# Patient Record
Sex: Male | Born: 1985 | Race: Black or African American | Hispanic: No | Marital: Single | Smoking: Former smoker
Health system: Southern US, Community
[De-identification: ages and names within clinical notes are randomized; demographics above are authoritative.]

## PROBLEM LIST (undated history)

## (undated) DIAGNOSIS — S62309A Unspecified fracture of unspecified metacarpal bone, initial encounter for closed fracture: Secondary | ICD-10-CM

## (undated) DIAGNOSIS — Z98811 Dental restoration status: Secondary | ICD-10-CM

## (undated) HISTORY — PX: NO PAST SURGERIES: SHX2092

---
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
## 2007-01-21 ENCOUNTER — Emergency Department (HOSPITAL_COMMUNITY): Admission: EM | Admit: 2007-01-21 | Discharge: 2007-01-21 | Payer: Self-pay | Admitting: Emergency Medicine

## 2007-03-28 ENCOUNTER — Emergency Department (HOSPITAL_COMMUNITY): Admission: EM | Admit: 2007-03-28 | Discharge: 2007-03-29 | Payer: Self-pay | Admitting: Emergency Medicine

## 2007-04-11 ENCOUNTER — Emergency Department (HOSPITAL_COMMUNITY): Admission: EM | Admit: 2007-04-11 | Discharge: 2007-04-11 | Payer: Self-pay | Admitting: Emergency Medicine

## 2009-01-12 ENCOUNTER — Observation Stay (HOSPITAL_COMMUNITY): Admission: EM | Admit: 2009-01-12 | Discharge: 2009-01-12 | Payer: Self-pay | Admitting: Emergency Medicine

## 2010-04-26 IMAGING — CT CT NECK W/ CM
3 of 4 series · 16 of 33 positions shown, 19 images · IV contrast (agent unspecified)
Comparison: None

CLINICAL DATA: Sore throat and difficulty swallowing.

CT NECK WITH CONTRAST
TECHNIQUE: Multidetector CT imaging of the neck was performed with
intravenous contrast.
Contrast: 100 ml [id number]-NDD

[Series 3: neck st · axial · 0.39mm/px · z∈[-258,-69]mm · 8 of 83 slices shown, 10 images]
[im 10/83  soft-tissue]
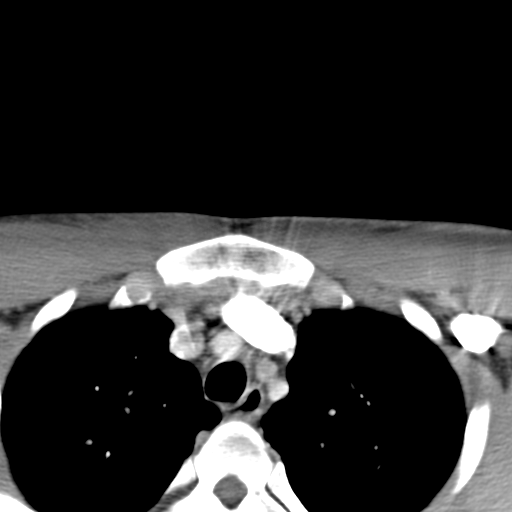
[im 10/83  bone]
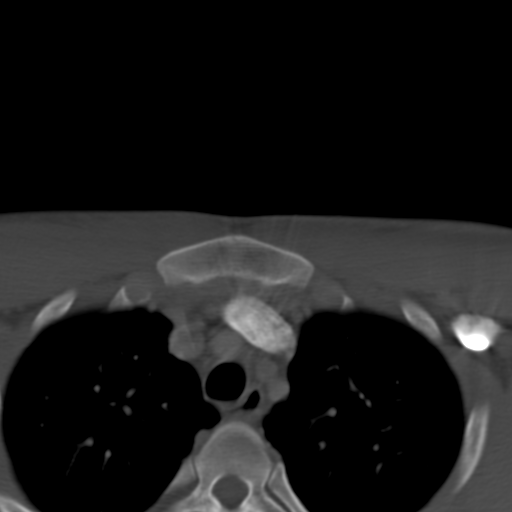
[im 19/83  bone]
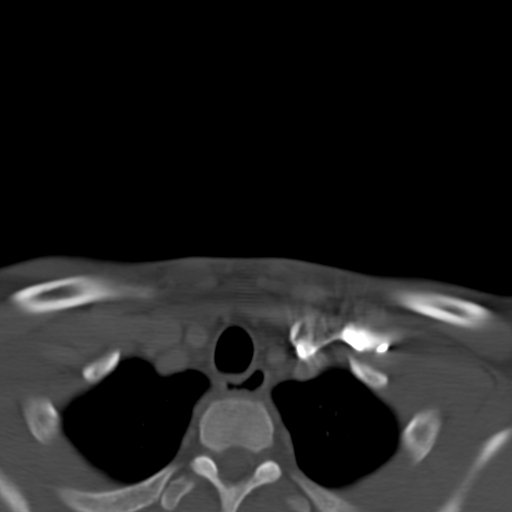
[im 28/83  bone]
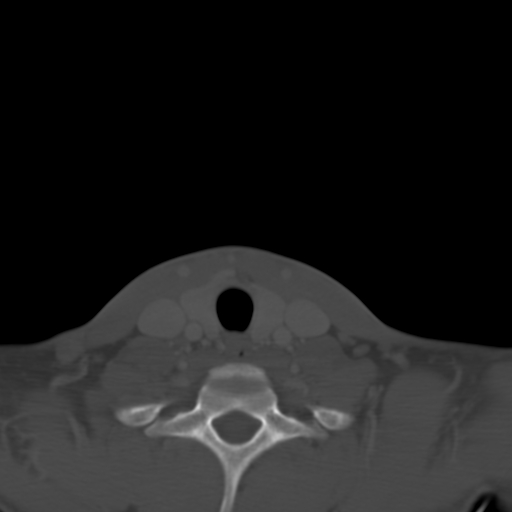
[im 37/83  bone]
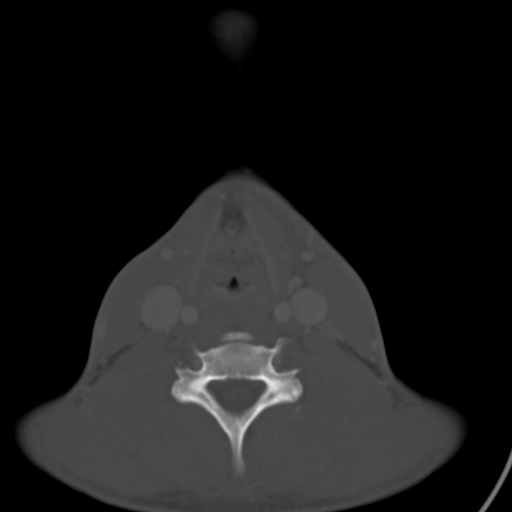
[im 46/83  soft-tissue]
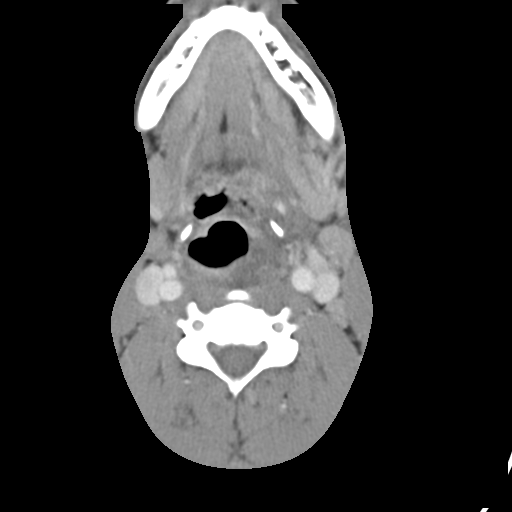
[im 46/83  bone]
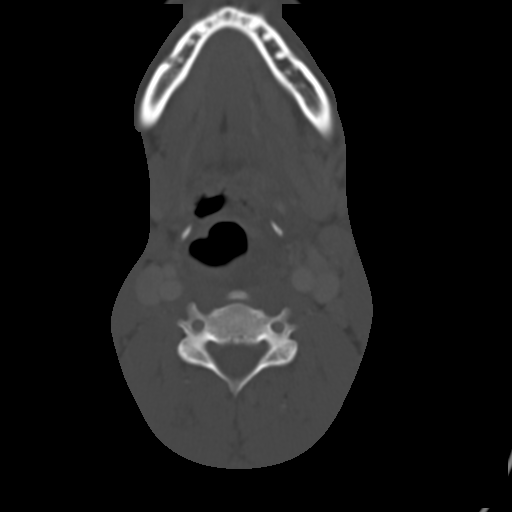
[im 55/83  bone]
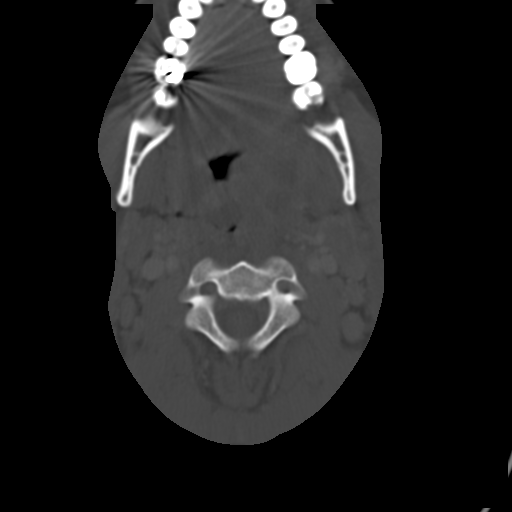
[im 64/83  bone]
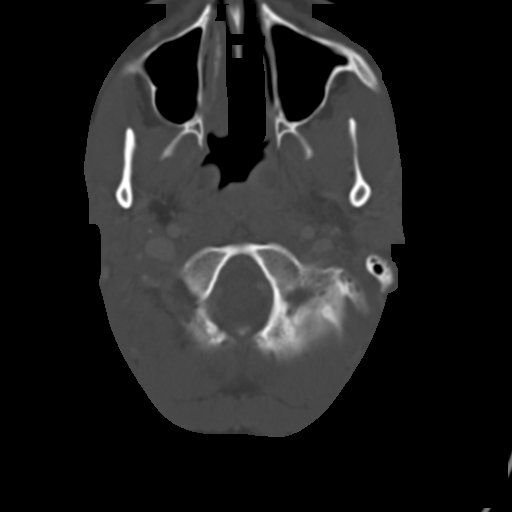
[im 73/83  bone]
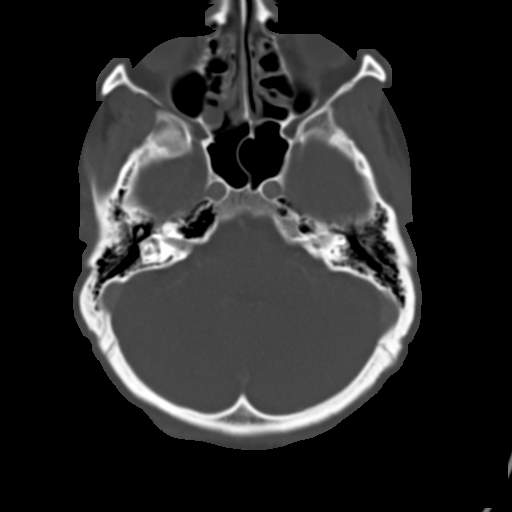

[Series 602: <mpr thick range> · coronal · 0.49mm/px · 3 of 47 slices shown]
[im 13/47  bone]
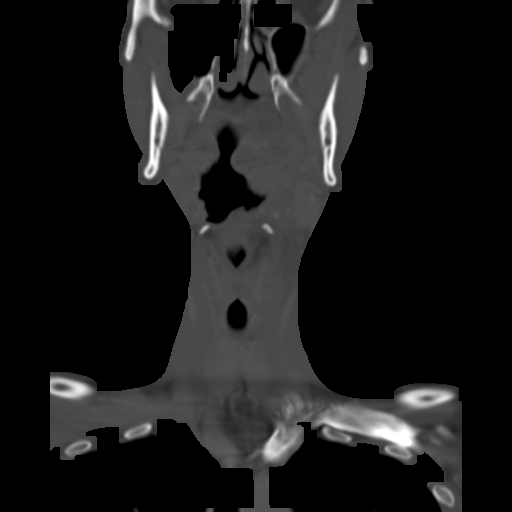
[im 20/47  bone]
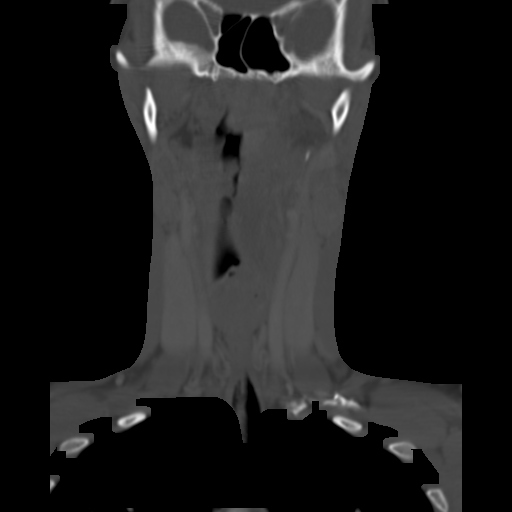
[im 27/47  bone]
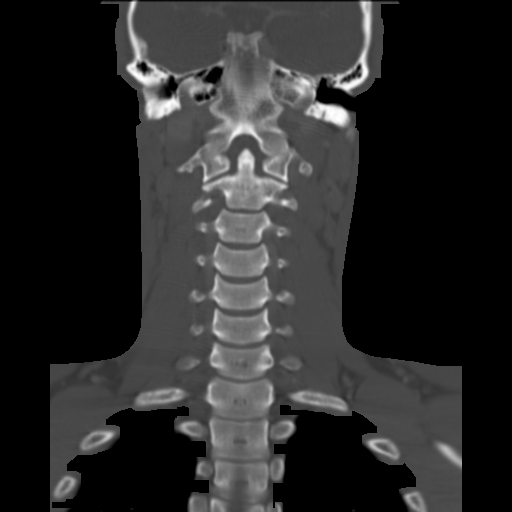

[Series 603: <mpr thick range(1)> · sagittal · 0.49mm/px · 5 of 43 slices shown, 6 images]
[im 15/43  bone]
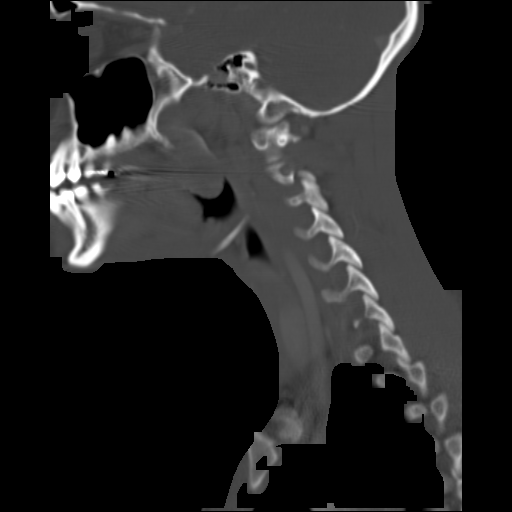
[im 18/43  bone]
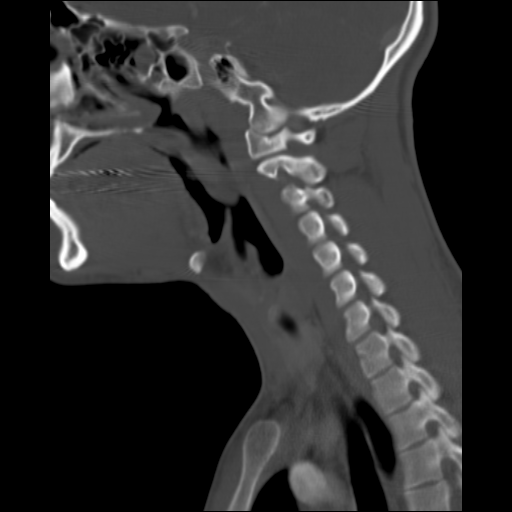
[im 22/43  soft-tissue]
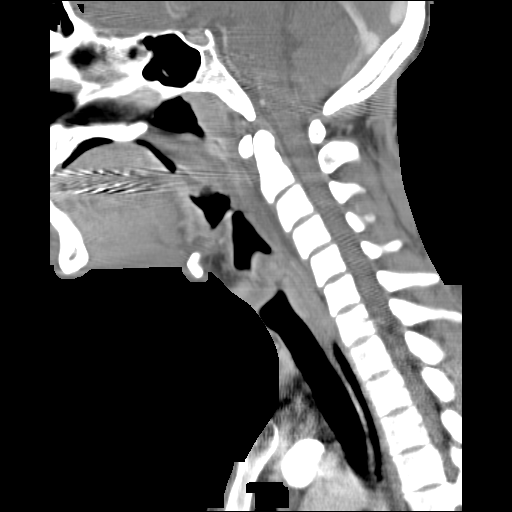
[im 22/43  bone]
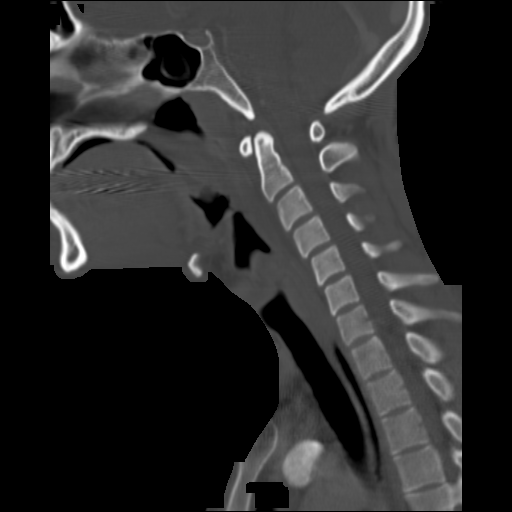
[im 25/43  bone]
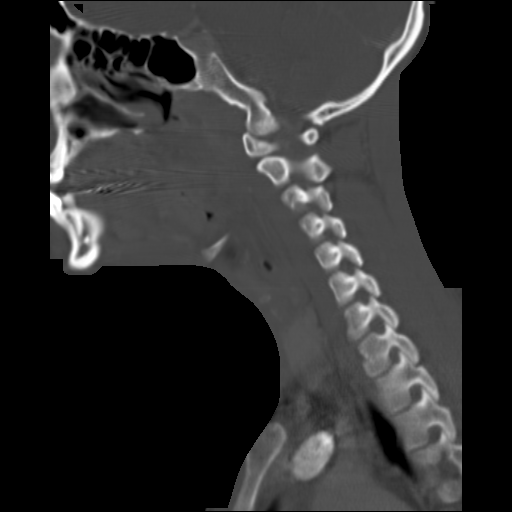
[im 29/43  bone]
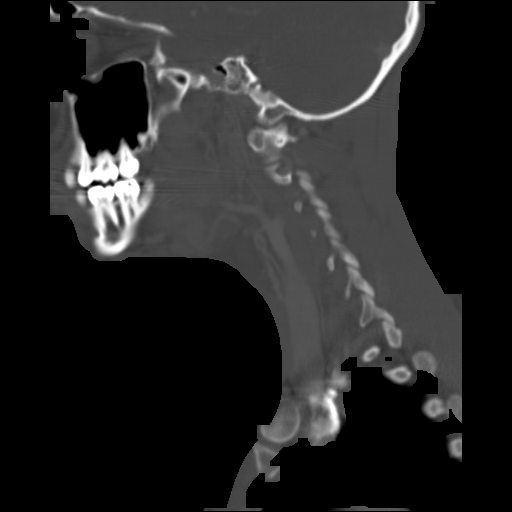

[16 of 33 positions shown; findings below may reference images not displayed]

FINDINGS: The visualized portion of the brain appears normal.  The
major intracranial vascular structures appear normal.  The
paranasal sinuses mastoid air cells are clear except for scattered
ethmoid disease.

The floor the mouth and tongue base are unremarkable.  There is
marked swelling of the palatine tonsils left much greater than
right with multiple fluid collections suggesting tonsillar
abscesses.  There is marked inflammation of the uvula and there is
a phlegmonous inflammatory process tracking down from the left
tonsillar region involving the left aryepiglottic fold and the left
piriform sinus.  There is also associated cellulitis in the
overlying subcutaneous tissues.  There are enlarged cervical lymph
nodes bilaterally, left much greater than right.  There is mild
inflammation along the left aspect of the epiglottis but no
significant thickening.  Some inflammation in the left aspect of
the vallecula.

The major vascular structures appear normal.  The thyroid gland is
normal.  The lung apices are clear.  A small supraclavicular nodes
are noted bilaterally.
IMPRESSION: 1.  Severe inflammatory process extending from the fossa of
Rosenm[address] all way down to the just above the true cords mainly on
the left side with tonsillar abscesses and surrounding phlegmon
/fluid along with superficial cellulitis.
2.  Associated inflammatory adenopathy in the neck.

## 2010-12-15 LAB — POCT I-STAT, CHEM 8
BUN: 7 mg/dL (ref 6–23)
Calcium, Ion: 1.11 mmol/L — ABNORMAL LOW (ref 1.12–1.32)
Chloride: 103 meq/L (ref 96–112)
Creatinine, Ser: 1.2 mg/dL (ref 0.4–1.5)
Glucose, Bld: 110 mg/dL — ABNORMAL HIGH (ref 70–99)
HCT: 47 % (ref 39.0–52.0)
Hemoglobin: 16 g/dL (ref 13.0–17.0)
Potassium: 3.7 meq/L (ref 3.5–5.1)
Sodium: 137 mEq/L (ref 135–145)
TCO2: 27 mmol/L (ref 0–100)

## 2010-12-15 LAB — CBC
HCT: 44.3 % (ref 39.0–52.0)
Hemoglobin: 15.3 g/dL (ref 13.0–17.0)
MCHC: 34.5 g/dL (ref 30.0–36.0)
MCV: 88.6 fL (ref 78.0–100.0)
Platelets: 186 K/uL (ref 150–400)
RBC: 5 MIL/uL (ref 4.22–5.81)
RDW: 13.6 % (ref 11.5–15.5)
WBC: 14 10*3/uL — ABNORMAL HIGH (ref 4.0–10.5)

## 2010-12-15 LAB — DIFFERENTIAL
Basophils Absolute: 0 K/uL (ref 0.0–0.1)
Basophils Relative: 0 % (ref 0–1)
Eosinophils Absolute: 0.1 10*3/uL (ref 0.0–0.7)
Eosinophils Relative: 1 % (ref 0–5)
Lymphocytes Relative: 7 % — ABNORMAL LOW (ref 12–46)
Lymphs Abs: 0.9 10*3/uL (ref 0.7–4.0)
Monocytes Absolute: 1.1 10*3/uL — ABNORMAL HIGH (ref 0.1–1.0)
Monocytes Relative: 8 % (ref 3–12)
Neutro Abs: 11.9 K/uL — ABNORMAL HIGH (ref 1.7–7.7)
Neutrophils Relative %: 85 % — ABNORMAL HIGH (ref 43–77)

## 2010-12-15 LAB — MONONUCLEOSIS SCREEN: Mono Screen: NEGATIVE

## 2010-12-15 LAB — RAPID STREP SCREEN (MED CTR MEBANE ONLY): Streptococcus, Group A Screen (Direct): NEGATIVE

## 2011-06-21 LAB — DIFFERENTIAL
Basophils Absolute: 0
Lymphocytes Relative: 5 — ABNORMAL LOW
Neutro Abs: 6.1

## 2011-06-21 LAB — POCT INFECTIOUS MONO SCREEN: Mono Screen: NEGATIVE

## 2011-06-21 LAB — STREP A DNA PROBE: Group A Strep Probe: NEGATIVE

## 2011-06-21 LAB — CBC
Hemoglobin: 16.7
Platelets: 208
RDW: 13.8

## 2015-10-27 ENCOUNTER — Emergency Department (HOSPITAL_COMMUNITY)
Admission: EM | Admit: 2015-10-27 | Discharge: 2015-10-27 | Disposition: A | Discharge status: Home / Self Care | Payer: Self-pay | Attending: Emergency Medicine | Admitting: Emergency Medicine

## 2015-10-27 ENCOUNTER — Emergency Department (HOSPITAL_COMMUNITY): Payer: Self-pay

## 2015-10-27 ENCOUNTER — Encounter (HOSPITAL_COMMUNITY): Payer: Self-pay | Admitting: *Deleted

## 2015-10-27 DIAGNOSIS — Y9367 Activity, basketball: Secondary | ICD-10-CM | POA: Insufficient documentation

## 2015-10-27 DIAGNOSIS — S62309A Unspecified fracture of unspecified metacarpal bone, initial encounter for closed fracture: Secondary | ICD-10-CM

## 2015-10-27 DIAGNOSIS — S62325A Displaced fracture of shaft of fourth metacarpal bone, left hand, initial encounter for closed fracture: Secondary | ICD-10-CM | POA: Insufficient documentation

## 2015-10-27 DIAGNOSIS — Y998 Other external cause status: Secondary | ICD-10-CM | POA: Insufficient documentation

## 2015-10-27 DIAGNOSIS — Y92310 Basketball court as the place of occurrence of the external cause: Secondary | ICD-10-CM | POA: Insufficient documentation

## 2015-10-27 DIAGNOSIS — W228XXA Striking against or struck by other objects, initial encounter: Secondary | ICD-10-CM | POA: Insufficient documentation

## 2015-10-27 DIAGNOSIS — S60221A Contusion of right hand, initial encounter: Secondary | ICD-10-CM | POA: Insufficient documentation

## 2015-10-27 DIAGNOSIS — S60511A Abrasion of right hand, initial encounter: Secondary | ICD-10-CM | POA: Insufficient documentation

## 2015-10-27 DIAGNOSIS — Z87891 Personal history of nicotine dependence: Secondary | ICD-10-CM | POA: Insufficient documentation

## 2015-10-27 DIAGNOSIS — S62317A Displaced fracture of base of fifth metacarpal bone. left hand, initial encounter for closed fracture: Secondary | ICD-10-CM | POA: Insufficient documentation

## 2015-10-27 DIAGNOSIS — Z98811 Dental restoration status: Secondary | ICD-10-CM | POA: Insufficient documentation

## 2015-10-27 HISTORY — DX: Unspecified fracture of unspecified metacarpal bone, initial encounter for closed fracture: S62.309A

## 2015-10-27 MED ORDER — OXYCODONE-ACETAMINOPHEN 5-325 MG PO TABS: 2.0000 | ORAL_TABLET | ORAL | Status: DC | PRN

## 2015-10-27 NOTE — Discharge Instructions (Signed)
Boxer's Knuckle °Boxer's knuckle is an injury to an extensor tendon. The extensor tendons are located on the back of the hand. They help the fingers to extend. They also help to protect the finger bones and joints. Boxer's knuckle develops if the layer of tissue that lies over these tendons becomes damaged and causes a tendon to move out of position. Boxer's knuckle often affects the first knuckle of the middle finger. °CAUSES °This condition is caused by direct or repeated injury (trauma) to a knuckle. If often happens during activities such boxing or martial arts.  °RISK FACTORS °This condition is more likely to develop in: °· People who participate in hitting or fighting sports, such as boxing and martial arts. °· People who play contact sports, such as football and rugby. °· People who have poor strength and flexibility. °· People who have injured a knuckle. °SYMPTOMS  °Symptoms of this condition include: °· Pain and swelling over the injured knuckle. °· Difficulty straightening the affected finger. °· Delay when you try to straighten the affected finger. °· Tenderness when you touch the injured knuckle. °· Abnormal movement of the affected tendon when you open and close your hand. °DIAGNOSIS °This condition is diagnosed with a physical exam. Sometimes, X-rays are taken to check for additional problems, such as a fracture or cyst in the bone under the injured area. °TREATMENT °This condition may be treated with: °· Ice applied to the affected area. °· Medicines for pain. °· Placing the hand in a cast or splint to keep the injured joint from moving while the tendon heals. °· Surgery to repair the injured tendon or tissue. This may be done in severe cases. °HOME CARE INSTRUCTIONS °If You Have a Cast: °· Do not stick anything inside the cast to scratch your skin. Doing that increases your risk of infection. °· Check the skin around the cast every day. Report any concerns to your health care provider. You may put  lotion on dry skin around the edges of the cast. Do not apply lotion to the skin underneath the cast. °· Keep the cast clean and dry. °If You Have a Splint: °· Wear it as told by your health care provider. Remove it only as told by your health care provider. °· Loosen the splint if your fingers become numb and tingle, or if they turn cold and blue. °· Keep the splint clean and dry. °Bathing °· Do not take baths, swim, or use a hot tub until your health care provider approves. Ask your health care provider if you can take showers. You may only be allowed to take sponge baths for bathing. °· If your health care provider approves bathing and showering, cover the cast or splint with a watertight plastic bag to protect it from water. Do not let the cast or splint get wet. °Managing Pain, Stiffness, and Swelling °· If directed, apply ice to the injured area. °¨ Put ice in a plastic bag. °¨ Place a towel between your skin and the bag. °¨ Leave the ice on for 20 minutes, 2-3 times per day. °Driving °· Do not drive or operate heavy machinery while taking prescription pain medicine. °· Ask your health care provider when it is safe to drive if you have a cast or splint on your hand. °General Instructions °· Do not put pressure on any part of the cast or splint until it is fully hardened. This may take several hours. °· Do not use any tobacco products, including cigarettes, chewing tobacco,   or e-cigarettes. Tobacco can delay bone healing. If you need help quitting, ask your health care provider.  Take over-the-counter and prescription medicines only as told by your health care provider.  Keep all follow-up visits as told by your health care provider. This is important. SEEK MEDICAL CARE IF:  Your pain gets worse.  You hand tingles or feels numb.  Your hand becomes discolored.   This information is not intended to replace advice given to you by your health care provider. Make sure you discuss any questions you have  with your health care provider.   Document Released: 08/23/2005 Document Revised: 05/14/2015 Document Reviewed: 10/24/2014 Elsevier Interactive Patient Education 2016 [employer]  Metacarpal Fracture Fractures of metacarpals are breaks in the bones of the hand. They extend from the knuckles to the wrist. These bones can break in many ways. There are different ways of treating these fractures. HOME CARE  Only exercise as told by your doctor.  Return to activities as told by your doctor.  Go to physical therapy as told by your doctor.  Follow your doctor's advice about driving.  Keep the injured hand raised (elevated) above the level of your heart.  If a plaster, fiberglass, or pre-formed splint was applied:  Wear your splint as told and until you are examined again.  Apply ice on the injury for 15-20 minutes at a time, 03-04 times a day. Put the ice in a plastic bag. Place a towel between your skin and the bag.  Do not get your splint or cast wet. Protect it during bathing with a plastic bag.  Loosen the elastic bandage around the splint if your fingers start to get numb, tingle, get cold, or turn blue.  If the splint is plaster, do not lean it on hard surfaces or put pressure on it for 24 hours after it is put on.  Do not  try to scratch the skin under the cast.  Check the skin around the cast every day. You may put lotion on red or sore areas.  Move the fingers of your casted hand several times a day.  Only take medicine as told by your doctor.  Follow up as told by your doctor. This is very important in order to avoid permanent injury, disability, or lasting (chronic) pain. GET HELP RIGHT AWAY IF:   You develop a rash.  You have problems breathing.  You have any allergy problems.  You have more than a small spot of blood from beneath your cast or splint.  You have redness, puffiness (swelling), or more pain from beneath your cast or splint.  Yellowish-white  fluid (pus) comes from beneath your cast or splint.  You develop a temperature by mouth above 102 F (38.9 C), not controlled by medicine.  You have a bad smell coming from under your cast or splint.  You have problems moving any of your fingers. If you do not have a window in your cast for looking at the wound, a fluid or a little bleeding may show up as a stain on the outside of your cast. Tell your doctor about any stains you see. MAKE SURE YOU:   Understand these instructions.  Will watch your condition.  Will get help right away if you are not doing well or get worse.   This information is not intended to replace advice given to you by your health care provider. Make sure you discuss any questions you have with your health care provider.  Follow up with hand surgery for re-evaluation. Apply ice to affected area. Keep arm elevated. Return to the ED if you experience severe worsening of your symptoms, increased swelling, chest pain, shortness of breath, numbness/tingling in your hand, fever.

## 2015-10-27 NOTE — ED Provider Notes (Signed)
CSN: [id number]     Arrival date & time 10/27/15  2044 History  By signing my name below, I, Brandon Wallace, attest that this documentation has been prepared under the direction and in the presence of Gaylyn Rong, PA-C Electronically Signed: Soijett Wallace, ED Scribe. 10/27/2015. 9:28 PM.   Chief Complaint  Patient presents with  . Hand Injury     The history is provided by the patient. No language interpreter was used.    Brandon Wallace is a 30 y.o. male who presents to the Emergency Department complaining of right hand injury occurring tonight PTA. He notes that he was playing basketball when he got angry after being fouled and punched a wall. Pt is having associated symptoms of right hand swelling, tingling to right hand, small abrasion to right knuckle. He notes that he has not tried any medications for the relief of his symptoms. He denies numbness, color change, rash, and any other symptoms.    No past medical history on file. No past surgical history on file. No family history on file. Social History  Substance Use Topics  . Smoking status: Not on file  . Smokeless tobacco: Not on file  . Alcohol Use: Not on file    Review of Systems  Constitutional: Negative for fever.  Musculoskeletal: Positive for joint swelling and arthralgias.  Skin: Positive for wound (abrasion to right knuckle). Negative for color change and rash.  Neurological: Negative for numbness.       Tingling to right hand  All other systems reviewed and are negative.     Allergies  Review of patient's allergies indicates not on file.  Home Medications   Prior to Admission medications   Not on File   BP 123/90 mmHg  Pulse 64  Temp(Src) 98.1 F (36.7 C) (Oral)  Resp 16  SpO2 99% Physical Exam  Constitutional: He is oriented to person, place, and time. He appears well-developed and well-nourished. No distress.  HENT:  Head: Normocephalic and atraumatic.  Eyes: Conjunctivae are normal. Right eye  exhibits no discharge. Left eye exhibits no discharge. No scleral icterus.  Cardiovascular: Normal rate.   Pulmonary/Chest: Effort normal.  Musculoskeletal:  Significant swelling and ecchymosis over R 4th and 5th metacarpals. No decrease ROM. No evidence of tendon injury. No sensory deficits.   Neurological: He is alert and oriented to person, place, and time. Coordination normal.  Skin: Skin is warm and dry. No rash noted. He is not diaphoretic. No erythema. No pallor.  Psychiatric: He has a normal mood and affect. His behavior is normal.  Nursing note and vitals reviewed.   ED Course  Procedures (including critical care time) DIAGNOSTIC STUDIES: Oxygen Saturation is 99% on RA, nl by my interpretation.    COORDINATION OF CARE: 9:27 PM Discussed treatment plan with pt at bedside which includes right hand xray and pt agreed to plan.    Labs Review Labs Reviewed - No data to display  Imaging Review Dg Hand Complete Right  10/27/2015  CLINICAL DATA:  Punched a wall playing basketball. EXAM: RIGHT HAND - COMPLETE 3+ VIEW COMPARISON:  None. FINDINGS: There are midshaft fractures of the fourth and fifth metacarpals with a dorsal apex angulation at the fracture sites. The joint spaces are maintained. No other fractures are identified. IMPRESSION: Angulated midshaft fractures of the fourth and fifth metacarpals. Electronically Signed   By: Rudie Meyer M.D.   On: 10/27/2015 21:50   I have personally reviewed and evaluated these images as part  of my medical decision-making.   EKG Interpretation None      MDM   Final diagnoses:  Metacarpal bone fracture, closed, initial encounter     Patient X-Ray reveals angulated midshaft fractures of the fourth and fifth metacarpals. Pt advised to follow up with hand surgery. Patient given brace while in ED, conservative therapy recommended and discussed. Patient will be discharged home & is agreeable with above plan. Returns precautions  discussed. Pt appears safe for discharge.   I personally performed the services described in this documentation, which was scribed in my presence. The recorded information has been reviewed and is accurate.     Lester Kinsman [place], PA-C 10/27/15 2234  Benjiman Core, MD 10/28/15 [phone]

## 2015-10-27 NOTE — Progress Notes (Signed)
Orthopedic Tech Progress Note Patient Details:  Brandon Wallace [birth year] [id number] Applied fiberglass ulnar gutter splint to RUE.  Pulses, sensation, motion intact before and after splinting.  Capillary refill less than 2 seconds before and after splinting. Ortho Devices Type of Ortho Device: Ulna gutter splint Ortho Device/Splint Location: RUE Ortho Device/Splint Interventions: Application   Lesle Chris 10/27/2015, 10:53 PM

## 2015-10-27 NOTE — ED Notes (Signed)
Pt in c/o injury to his right hand, states he punched a wall tonight, swelling noted

## 2015-10-28 ENCOUNTER — Other Ambulatory Visit: Payer: Self-pay | Admitting: Orthopedic Surgery

## 2015-10-28 ENCOUNTER — Encounter (HOSPITAL_BASED_OUTPATIENT_CLINIC_OR_DEPARTMENT_OTHER): Payer: Self-pay | Admitting: *Deleted

## 2015-10-29 ENCOUNTER — Ambulatory Visit (HOSPITAL_BASED_OUTPATIENT_CLINIC_OR_DEPARTMENT_OTHER): Payer: Self-pay | Admitting: Anesthesiology

## 2015-10-29 ENCOUNTER — Ambulatory Visit (HOSPITAL_BASED_OUTPATIENT_CLINIC_OR_DEPARTMENT_OTHER)
Admission: RE | Admit: 2015-10-29 | Discharge: 2015-10-29 | Disposition: A | Discharge status: Home / Self Care | Payer: Self-pay | Source: Ambulatory Visit | Attending: Orthopedic Surgery | Admitting: Orthopedic Surgery

## 2015-10-29 ENCOUNTER — Encounter (HOSPITAL_BASED_OUTPATIENT_CLINIC_OR_DEPARTMENT_OTHER): Payer: Self-pay | Admitting: *Deleted

## 2015-10-29 ENCOUNTER — Encounter (HOSPITAL_BASED_OUTPATIENT_CLINIC_OR_DEPARTMENT_OTHER)
Admission: RE | Disposition: A | Discharge status: Home / Self Care | Payer: Self-pay | Source: Ambulatory Visit | Attending: Orthopedic Surgery

## 2015-10-29 DIAGNOSIS — W228XXA Striking against or struck by other objects, initial encounter: Secondary | ICD-10-CM | POA: Insufficient documentation

## 2015-10-29 DIAGNOSIS — S62326A Displaced fracture of shaft of fifth metacarpal bone, right hand, initial encounter for closed fracture: Secondary | ICD-10-CM | POA: Insufficient documentation

## 2015-10-29 DIAGNOSIS — S62324A Displaced fracture of shaft of fourth metacarpal bone, right hand, initial encounter for closed fracture: Secondary | ICD-10-CM | POA: Insufficient documentation

## 2015-10-29 HISTORY — DX: Unspecified fracture of unspecified metacarpal bone, initial encounter for closed fracture: S62.309A

## 2015-10-29 HISTORY — DX: Dental restoration status: Z98.811

## 2015-10-29 HISTORY — PX: OPEN REDUCTION INTERNAL FIXATION (ORIF) METACARPAL: SHX6234

## 2015-10-29 SURGERY — OPEN REDUCTION INTERNAL FIXATION (ORIF) METACARPAL
Anesthesia: Regional | Site: Finger | Laterality: Right

## 2015-10-29 MED ORDER — ONDANSETRON HCL 4 MG/2ML IJ SOLN: 4.0000 mg | Freq: Four times a day (QID) | INTRAMUSCULAR | Status: DC | PRN

## 2015-10-29 MED ORDER — LIDOCAINE HCL (CARDIAC) 20 MG/ML IV SOLN
INTRAVENOUS | Status: DC | PRN
  Administered 2015-10-29: 70 mg via INTRAVENOUS

## 2015-10-29 MED ORDER — ONDANSETRON HCL 4 MG/2ML IJ SOLN
INTRAMUSCULAR | Status: DC | PRN
  Administered 2015-10-29: 4 mg via INTRAVENOUS

## 2015-10-29 MED ORDER — BUPIVACAINE-EPINEPHRINE (PF) 0.5% -1:200000 IJ SOLN
INTRAMUSCULAR | Status: DC | PRN
  Administered 2015-10-29: 30 mL via PERINEURAL

## 2015-10-29 MED ORDER — HYDROMORPHONE HCL 1 MG/ML IJ SOLN: 0.2500 mg | INTRAMUSCULAR | Status: DC | PRN

## 2015-10-29 MED ORDER — SCOPOLAMINE 1 MG/3DAYS TD PT72: 1.0000 | MEDICATED_PATCH | Freq: Once | TRANSDERMAL | Status: DC | PRN

## 2015-10-29 MED ORDER — CEFAZOLIN SODIUM-DEXTROSE 2-3 GM-% IV SOLR
INTRAVENOUS | Status: DC | PRN
  Administered 2015-10-29: 2 g via INTRAVENOUS

## 2015-10-29 MED ORDER — VANCOMYCIN HCL IN DEXTROSE 1-5 GM/200ML-% IV SOLN: 1000.0000 mg | INTRAVENOUS | Status: DC

## 2015-10-29 MED ORDER — BUPIVACAINE HCL (PF) 0.25 % IJ SOLN
INTRAMUSCULAR | Status: AC
  Filled 2015-10-29: qty 30

## 2015-10-29 MED ORDER — OXYCODONE HCL 5 MG/5ML PO SOLN: 5.0000 mg | Freq: Once | ORAL | Status: DC | PRN

## 2015-10-29 MED ORDER — OXYCODONE-ACETAMINOPHEN 5-325 MG PO TABS: 1.0000 | ORAL_TABLET | ORAL | Status: DC | PRN

## 2015-10-29 MED ORDER — FENTANYL CITRATE (PF) 100 MCG/2ML IJ SOLN
INTRAMUSCULAR | Status: AC
  Filled 2015-10-29: qty 2

## 2015-10-29 MED ORDER — LIDOCAINE HCL (PF) 1 % IJ SOLN
INTRAMUSCULAR | Status: AC
  Filled 2015-10-29: qty 30

## 2015-10-29 MED ORDER — FENTANYL CITRATE (PF) 100 MCG/2ML IJ SOLN
50.0000 ug | INTRAMUSCULAR | Status: DC | PRN
  Administered 2015-10-29: 100 ug via INTRAVENOUS

## 2015-10-29 MED ORDER — DEXAMETHASONE SODIUM PHOSPHATE 10 MG/ML IJ SOLN
INTRAMUSCULAR | Status: DC | PRN
  Administered 2015-10-29: 10 mg via INTRAVENOUS

## 2015-10-29 MED ORDER — GLYCOPYRROLATE 0.2 MG/ML IJ SOLN: 0.2000 mg | Freq: Once | INTRAMUSCULAR | Status: DC | PRN

## 2015-10-29 MED ORDER — MIDAZOLAM HCL 2 MG/2ML IJ SOLN
1.0000 mg | INTRAMUSCULAR | Status: DC | PRN
  Administered 2015-10-29: 2 mg via INTRAVENOUS

## 2015-10-29 MED ORDER — PROPOFOL 10 MG/ML IV BOLUS
INTRAVENOUS | Status: DC | PRN
  Administered 2015-10-29: 200 mg via INTRAVENOUS

## 2015-10-29 MED ORDER — OXYCODONE HCL 5 MG PO TABS: 5.0000 mg | ORAL_TABLET | Freq: Once | ORAL | Status: DC | PRN

## 2015-10-29 MED ORDER — LACTATED RINGERS IV SOLN
INTRAVENOUS | Status: DC
  Administered 2015-10-29 (×2): via INTRAVENOUS

## 2015-10-29 MED ORDER — CHLORHEXIDINE GLUCONATE 4 % EX LIQD: 60.0000 mL | Freq: Once | CUTANEOUS | Status: DC

## 2015-10-29 MED ORDER — MIDAZOLAM HCL 2 MG/2ML IJ SOLN
INTRAMUSCULAR | Status: AC
  Filled 2015-10-29: qty 2

## 2015-10-29 MED ORDER — CEFAZOLIN SODIUM-DEXTROSE 2-3 GM-% IV SOLR
INTRAVENOUS | Status: AC
  Filled 2015-10-29: qty 50

## 2015-10-29 MED ORDER — VANCOMYCIN HCL IN DEXTROSE 1-5 GM/200ML-% IV SOLN
INTRAVENOUS | Status: AC
  Filled 2015-10-29: qty 200

## 2015-10-29 SURGICAL SUPPLY — 71 items
APL SKNCLS STERI-STRIP NONHPOA (GAUZE/BANDAGES/DRESSINGS)
BANDAGE ACE 3X5.8 VEL STRL LF (GAUZE/BANDAGES/DRESSINGS) ×3 IMPLANT
BANDAGE ACE 4X5 VEL STRL LF (GAUZE/BANDAGES/DRESSINGS) IMPLANT
BENZOIN TINCTURE PRP APPL 2/3 (GAUZE/BANDAGES/DRESSINGS) IMPLANT
BLADE SURG 15 STRL LF DISP TIS (BLADE) ×1 IMPLANT
BLADE SURG 15 STRL SS (BLADE) ×3
BNDG CMPR 9X4 STRL LF SNTH (GAUZE/BANDAGES/DRESSINGS) ×1
BNDG ELASTIC 2X5.8 VLCR STR LF (GAUZE/BANDAGES/DRESSINGS) IMPLANT
BNDG ESMARK 4X9 LF (GAUZE/BANDAGES/DRESSINGS) ×2 IMPLANT
BNDG GAUZE ELAST 4 BULKY (GAUZE/BANDAGES/DRESSINGS) ×2 IMPLANT
CANISTER SUCT 1200ML W/VALVE (MISCELLANEOUS) IMPLANT
CLOSURE WOUND 1/2 X4 (GAUZE/BANDAGES/DRESSINGS) ×1
CORDS BIPOLAR (ELECTRODE) ×2 IMPLANT
COVER BACK TABLE 60X90IN (DRAPES) ×3 IMPLANT
CUFF TOURNIQUET SINGLE 18IN (TOURNIQUET CUFF) ×2 IMPLANT
DECANTER SPIKE VIAL GLASS SM (MISCELLANEOUS) IMPLANT
DRAPE EXTREMITY T 121X128X90 (DRAPE) ×3 IMPLANT
DRAPE OEC MINIVIEW 54X84 (DRAPES) ×3 IMPLANT
DRAPE SURG 17X23 STRL (DRAPES) ×3 IMPLANT
DURAPREP 26ML APPLICATOR (WOUND CARE) ×3 IMPLANT
GAUZE SPONGE 4X4 12PLY STRL (GAUZE/BANDAGES/DRESSINGS) ×3 IMPLANT
GAUZE SPONGE 4X4 16PLY XRAY LF (GAUZE/BANDAGES/DRESSINGS) IMPLANT
GAUZE XEROFORM 1X8 LF (GAUZE/BANDAGES/DRESSINGS) IMPLANT
GLOVE BIO SURGEON STRL SZ7 (GLOVE) ×2 IMPLANT
GLOVE SURG SYN 8.0 (GLOVE) ×6 IMPLANT
GLOVE SURG SYN 8.0 PF PI (GLOVE) ×2 IMPLANT
GOWN STRL REUS W/ TWL LRG LVL3 (GOWN DISPOSABLE) ×1 IMPLANT
GOWN STRL REUS W/TWL LRG LVL3 (GOWN DISPOSABLE) ×3
GOWN STRL REUS W/TWL XL LVL3 (GOWN DISPOSABLE) ×6 IMPLANT
NDL HYPO 25X1 1.5 SAFETY (NEEDLE) IMPLANT
NEEDLE HYPO 25X1 1.5 SAFETY (NEEDLE) IMPLANT
NS IRRIG 1000ML POUR BTL (IV SOLUTION) ×2 IMPLANT
PACK BASIN DAY SURGERY FS (CUSTOM PROCEDURE TRAY) ×3 IMPLANT
PAD CAST 3X4 CTTN HI CHSV (CAST SUPPLIES) ×1 IMPLANT
PAD CAST 4YDX4 CTTN HI CHSV (CAST SUPPLIES) IMPLANT
PADDING CAST ABS 4INX4YD NS (CAST SUPPLIES) ×2
PADDING CAST ABS COTTON 4X4 ST (CAST SUPPLIES) ×1 IMPLANT
PADDING CAST COTTON 3X4 STRL (CAST SUPPLIES) ×3
PADDING CAST COTTON 4X4 STRL (CAST SUPPLIES)
PADDING UNDERCAST 2 STRL (CAST SUPPLIES) ×2
PADDING UNDERCAST 2X4 STRL (CAST SUPPLIES) ×1 IMPLANT
PLATE STRAIGHT 1.5 12 HOLE (Plate) ×2 IMPLANT
PLATE STRAIGHT 1.5 6 HOLE (Plate) ×2 IMPLANT
SCREW CORTEX 1.5X7 (Screw) ×4 IMPLANT
SCREW CORTEX 1.5X8 (Screw) ×12 IMPLANT
SCREW CORTEX 1.5X9 (Screw) ×8 IMPLANT
SHEET MEDIUM DRAPE 40X70 STRL (DRAPES) ×3 IMPLANT
SLEEVE SCD COMPRESS KNEE MED (MISCELLANEOUS) ×3 IMPLANT
SPLINT PLASTER CAST XFAST 4X15 (CAST SUPPLIES) IMPLANT
SPLINT PLASTER XTRA FAST SET 4 (CAST SUPPLIES) ×30
STOCKINETTE 4X48 STRL (DRAPES) ×3 IMPLANT
STRIP CLOSURE SKIN 1/2X4 (GAUZE/BANDAGES/DRESSINGS) ×1 IMPLANT
SUCTION FRAZIER HANDLE 10FR (MISCELLANEOUS)
SUCTION TUBE FRAZIER 10FR DISP (MISCELLANEOUS) IMPLANT
SUT ETHILON 4 0 P 3 18 (SUTURE) ×2 IMPLANT
SUT ETHILON 4 0 PS 2 18 (SUTURE) IMPLANT
SUT ETHILON 5 0 PS 2 18 (SUTURE) IMPLANT
SUT MERSILENE 4 0 P 3 (SUTURE) IMPLANT
SUT VIC AB 2-0 PS2 27 (SUTURE) ×2 IMPLANT
SUT VIC AB 4-0 P-3 18XBRD (SUTURE) IMPLANT
SUT VIC AB 4-0 P3 18 (SUTURE)
SUT VICRYL 3-0 RB1 (SUTURE) ×2 IMPLANT
SUT VICRYL 4-0 PS2 18IN ABS (SUTURE) ×3 IMPLANT
SUT VICRYL RAPIDE 4-0 (SUTURE) IMPLANT
SUT VICRYL RAPIDE 4/0 PS 2 (SUTURE) IMPLANT
SYR BULB 3OZ (MISCELLANEOUS) IMPLANT
SYRINGE 10CC LL (SYRINGE) IMPLANT
TOWEL OR 17X24 6PK STRL BLUE (TOWEL DISPOSABLE) ×3 IMPLANT
TUBE CONNECTING 20'X1/4 (TUBING)
TUBE CONNECTING 20X1/4 (TUBING) IMPLANT
UNDERPAD 30X30 (UNDERPADS AND DIAPERS) ×3 IMPLANT

## 2015-10-29 NOTE — Anesthesia Procedure Notes (Addendum)
Anesthesia Regional Block:  Supraclavicular block  Pre-Anesthetic Checklist: ,, timeout performed, Correct Patient, Correct Site, Correct Laterality, Correct Procedure, Correct Position, site marked, Risks and benefits discussed,  Surgical consent,  Pre-op evaluation,  At surgeon's request and post-op pain management  Laterality: Right  Prep: chloraprep       Needles:  Injection technique: Single-shot  Needle Type: Echogenic Stimulator Needle     Needle Length: 5cm 5 cm Needle Gauge: 22 and 22 G    Additional Needles:  Procedures: ultrasound guided (picture in chart) and nerve stimulator Supraclavicular block  Nerve Stimulator or Paresthesia:  Response: biceps flexion, 0.45 mA,   Additional Responses:   Narrative:  Start time: 10/29/2015 8:52 AM End time: 10/29/2015 8:58 AM Injection made incrementally with aspirations every 5 mL.  Performed by: Personally  Anesthesiologist: HODIERNE, ADAM  Additional Notes: Functioning IV was confirmed and monitors were applied.  A 50mm 22ga Arrow echogenic stimulator needle was used. Sterile prep and drape,hand hygiene and sterile gloves were used.  Negative aspiration and negative test dose prior to incremental administration of local anesthetic. The patient tolerated the procedure well.  Ultrasound guidance: relevent anatomy identified, needle position confirmed, local anesthetic spread visualized around nerve(s), vascular puncture avoided.  Image printed for medical record.    Procedure Name: LMA Insertion Date/Time: 10/29/2015 10:15 AM Performed by: Burna Cash Pre-anesthesia Checklist: Patient identified, Emergency Drugs available, Suction available and Patient being monitored Patient Re-evaluated:Patient Re-evaluated prior to inductionOxygen Delivery Method: Circle System Utilized Preoxygenation: Pre-oxygenation with 100% oxygen Intubation Type: IV induction Ventilation: Mask ventilation without difficulty LMA: LMA  inserted LMA Size: 5.0 Number of attempts: 1 Airway Equipment and Method: bite block Placement Confirmation: positive ETCO2 Tube secured with: Tape Dental Injury: Teeth and Oropharynx as per pre-operative assessment

## 2015-10-29 NOTE — Op Note (Signed)
See note [id number]

## 2015-10-29 NOTE — Anesthesia Postprocedure Evaluation (Signed)
Anesthesia Post Note  Patient: Brandon Wallace  Procedure(s) Performed: Procedure(s) (LRB): OPEN REDUCTION INTERNAL FIXATION (ORIF) RIGHT SMALL RING METACARPAL FRACTURE (Right)  Patient location during evaluation: PACU Anesthesia Type: General Level of consciousness: awake and alert and patient cooperative Pain management: pain level controlled Vital Signs Assessment: post-procedure vital signs reviewed and stable Respiratory status: spontaneous breathing and respiratory function stable Cardiovascular status: stable Anesthetic complications: no    Last Vitals:  Filed Vitals:   10/29/15 1145 10/29/15 1200  BP: 120/90 113/82  Pulse: 57 64  Temp:    Resp: 17 22    Last Pain:  Filed Vitals:   10/29/15 1209  PainSc: 0-No pain                 Haskel Dewalt S

## 2015-10-29 NOTE — Anesthesia Preprocedure Evaluation (Signed)
Anesthesia Evaluation  Patient identified by MRN, date of birth, ID band Patient awake    Reviewed: Allergy & Precautions, H&P , NPO status , Patient's Chart, lab work & pertinent test results  Airway Mallampati: II   Neck ROM: full    Dental   Pulmonary former smoker,    breath sounds clear to auscultation       Cardiovascular negative cardio ROS   Rhythm:regular Rate:Normal     Neuro/Psych    GI/Hepatic   Endo/Other    Renal/GU      Musculoskeletal   Abdominal   Peds  Hematology   Anesthesia Other Findings   Reproductive/Obstetrics                             Anesthesia Physical Anesthesia Plan  ASA: I  Anesthesia Plan: General and Regional   Post-op Pain Management:    Induction: Intravenous  Airway Management Planned: LMA  Additional Equipment:   Intra-op Plan:   Post-operative Plan:   Informed Consent: I have reviewed the patients History and Physical, chart, labs and discussed the procedure including the risks, benefits and alternatives for the proposed anesthesia with the patient or authorized representative who has indicated his/her understanding and acceptance.     Plan Discussed with: CRNA, Anesthesiologist and Surgeon  Anesthesia Plan Comments:         Anesthesia Quick Evaluation

## 2015-10-29 NOTE — Op Note (Signed)
Brandon Wallace, BEDWELL                 ACCOUNT NO.:  [id number]  MEDICAL RECORD NO.:  [id number]  LOCATION:                                 FACILITY:  PHYSICIAN:  Artist Pais. Ason Heslin, M.D.DATE OF BIRTH:  [birth year]  DATE OF PROCEDURE:  10/29/2015 DATE OF DISCHARGE:                              OPERATIVE REPORT   PREOPERATIVE DIAGNOSIS:  Displaced right ring and small metacarpal fractures.  POSTOPERATIVE DIAGNOSIS:  Displaced right ring and small metacarpal fractures.  PROCEDURE:  Open reduction and internal fixation of above using 1.5-mm stainless steel 6-hole plate and screw.  SURGEON:  Artist Pais. [name], M.D.  ASSISTANT:  None.  ANESTHESIA:  Block and general.  COMPLICATION:  No complication.  DRAINS:  No drains.  DESCRIPTION OF PROCEDURE:  The patient was taken to the operating suite after induction of adequate axillary block analgesia and then general laryngeal mask airway anesthetic.  Right upper extremity was prepped and draped in usual sterile fashion.  An Esmarch was used to exsanguinate the limb.  Tourniquet was inflated to 250 mmHg.  At this point in time, an incision was made over the interval between the ring and small metacarpals.  Skin was incised sharply.  Dissection was carried down to the interval between the ring and small metacarpals.  We subperiosteally dissected the fracture site on the small finger including removal of clot at the fracture site.  Reduction was performed with downward pressure and slight traction on the small finger proximal phalanx.  Once this was done, we fashioned a 6-hole stainless steel 1.5-mm plate dorsal ulnarly, fixed with three cortical screws proximal and three cortical screws distal to the fracture site.  This was done under direct fluoroscopic imaging.  The same procedure was then performed for the ring finger.  We closed the soft tissues over the plates with 2-0 undyed Vicryl and the skin with a 4-0 Prolene subcuticular  stitch.  Steri- Strips, 4 x 4, fluffs and ulnar gutter splint were applied.  The patient tolerated the procedures well, went to the recovery room in stable fashion.     Artist Pais [name], M.D.    MAW/MEDQ  D:  10/29/2015  T:  10/29/2015  Job:  [id number]

## 2015-10-29 NOTE — H&P (Signed)
Brandon Wallace is an 30 y.o. male.   Chief Complaint: right hand pain HPI: as above s/p right hand trauma with small and ring metacarpal fractures  Past Medical History  Diagnosis Date  . Dental crown present   . Metacarpal bone fracture 10/27/2015    right small    Past Surgical History  Procedure Laterality Date  . No past surgeries      History reviewed. No pertinent family history. Social History:  reports that he quit smoking about 9 years ago. He has never used smokeless tobacco. He reports that he drinks alcohol. He reports that he does not use illicit drugs.  Allergies:  Allergies  Allergen Reactions  . Penicillins Other (See Comments)    UNKNOWN    No prescriptions prior to admission    No results found for this or any previous visit (from the past 48 hour(s)). Dg Hand Complete Right  10/27/2015  CLINICAL DATA:  Punched a wall playing basketball. EXAM: RIGHT HAND - COMPLETE 3+ VIEW COMPARISON:  None. FINDINGS: There are midshaft fractures of the fourth and fifth metacarpals with a dorsal apex angulation at the fracture sites. The joint spaces are maintained. No other fractures are identified. IMPRESSION: Angulated midshaft fractures of the fourth and fifth metacarpals. Electronically Signed   By: Rudie Meyer M.D.   On: 10/27/2015 21:50    Review of Systems  All other systems reviewed and are negative.   Height  (1.727 m), weight 72.576 kg (160 lb). Physical Exam  Constitutional: He is oriented to person, place, and time. He appears well-developed and well-nourished.  HENT:  Head: Normocephalic and atraumatic.  Cardiovascular: Normal rate.   Respiratory: Effort normal.  Musculoskeletal:       Right hand: He exhibits tenderness, bony tenderness and deformity.  Dorsally displaced right ring and small metacarpal fractures  Neurological: He is alert and oriented to person, place, and time.  Skin: Skin is warm. No erythema.  Psychiatric: He has a normal mood  and affect. His behavior is normal. Judgment and thought content normal.     Assessment/Plan As above  Plan ORIF above  Dairl Ponder A, MD 10/29/2015, 6:21 AM

## 2015-10-29 NOTE — Discharge Instructions (Signed)
° ° °  Regional Anesthesia Blocks ° °1. Numbness or the inability to move the "blocked" extremity may last from 3-48 hours after placement. The length of time depends on the medication injected and your individual response to the medication. If the numbness is not going away after 48 hours, call your surgeon. ° °2. The extremity that is blocked will need to be protected until the numbness is gone and the  Strength has returned. Because you cannot feel it, you will need to take extra care to avoid injury. Because it may be weak, you may have difficulty moving it or using it. You may not know what position it is in without looking at it while the block is in effect. ° °3. For blocks in the legs and feet, returning to weight bearing and walking needs to be done carefully. You will need to wait until the numbness is entirely gone and the strength has returned. You should be able to move your leg and foot normally before you try and bear weight or walk. You will need someone to be with you when you first try to ensure you do not fall and possibly risk injury. ° °4. Bruising and tenderness at the needle site are common side effects and will resolve in a few days. ° °5. Persistent numbness or new problems with movement should be communicated to the surgeon or the [facility] ([phone])/  Surgery Center ([phone]). ° ° ° °Post Anesthesia Home Care Instructions ° °Activity: °Get plenty of rest for the remainder of the day. A responsible adult should stay with you for 24 hours following the procedure.  °For the next 24 hours, DO NOT: °-Drive a car °-Operate machinery °-Drink alcoholic beverages °-Take any medication unless instructed by your physician °-Make any legal decisions or sign important papers. ° °Meals: °Start with liquid foods such as gelatin or soup. Progress to regular foods as tolerated. Avoid greasy, spicy, heavy foods. If nausea and/or vomiting occur, drink only clear liquids until  the nausea and/or vomiting subsides. Call your physician if vomiting continues. ° °Special Instructions/Symptoms: °Your throat may feel dry or sore from the anesthesia or the breathing tube placed in your throat during surgery. If this causes discomfort, gargle with warm salt water. The discomfort should disappear within 24 hours. ° °If you had a scopolamine patch placed behind your ear for the management of post- operative nausea and/or vomiting: ° °1. The medication in the patch is effective for 72 hours, after which it should be removed.  Wrap patch in a tissue and discard in the trash. Wash hands thoroughly with soap and water. °2. You may remove the patch earlier than 72 hours if you experience unpleasant side effects which may include dry mouth, dizziness or visual disturbances. °3. Avoid touching the patch. Wash your hands with soap and water after contact with the patch. °  ° °

## 2015-10-29 NOTE — Transfer of Care (Signed)
Immediate Anesthesia Transfer of Care Note  Patient: Brandon Wallace  Procedure(s) Performed: Procedure(s): OPEN REDUCTION INTERNAL FIXATION (ORIF) RIGHT SMALL RING METACARPAL FRACTURE (Right)  Patient Location: PACU  Anesthesia Type:GA combined with regional for post-op pain  Level of Consciousness: sedated  Airway & Oxygen Therapy: Patient Spontanous Breathing and Patient connected to face mask oxygen  Post-op Assessment: Report given to RN and Post -op Vital signs reviewed and stable  Post vital signs: Reviewed and stable  Last Vitals:  Filed Vitals:   10/29/15 0920 10/29/15 1121  BP: 125/66   Pulse: 70 60  Temp:    Resp: 18     Complications: No apparent anesthesia complications

## 2015-10-29 NOTE — Progress Notes (Signed)
Assisted Dr. Hodierne with right, ultrasound guided, supraclavicular block. Side rails up, monitors on throughout procedure. See vital signs in flow sheet. Tolerated Procedure well.  

## 2015-10-30 ENCOUNTER — Encounter (HOSPITAL_BASED_OUTPATIENT_CLINIC_OR_DEPARTMENT_OTHER): Payer: Self-pay | Admitting: Orthopedic Surgery

## 2016-03-11 ENCOUNTER — Encounter (HOSPITAL_COMMUNITY): Payer: Self-pay | Admitting: Emergency Medicine

## 2016-03-11 ENCOUNTER — Emergency Department (HOSPITAL_COMMUNITY)
Admission: EM | Admit: 2016-03-11 | Discharge: 2016-03-11 | Disposition: A | Discharge status: Home / Self Care | Payer: Self-pay | Attending: Emergency Medicine | Admitting: Emergency Medicine

## 2016-03-11 DIAGNOSIS — K0889 Other specified disorders of teeth and supporting structures: Secondary | ICD-10-CM | POA: Insufficient documentation

## 2016-03-11 DIAGNOSIS — Z87891 Personal history of nicotine dependence: Secondary | ICD-10-CM | POA: Insufficient documentation

## 2016-03-11 MED ORDER — IBUPROFEN 600 MG PO TABS: 600.0000 mg | ORAL_TABLET | Freq: Three times a day (TID) | ORAL | Status: DC

## 2016-03-11 MED ORDER — HYDROCODONE-ACETAMINOPHEN 5-325 MG PO TABS
1.0000 | ORAL_TABLET | Freq: Once | ORAL | Status: AC
  Administered 2016-03-11: 1 via ORAL
  Filled 2016-03-11: qty 1

## 2016-03-11 MED ORDER — CLINDAMYCIN HCL 150 MG PO CAPS: 450.0000 mg | ORAL_CAPSULE | Freq: Three times a day (TID) | ORAL | Status: DC

## 2016-03-11 NOTE — Discharge Instructions (Signed)
Community Resource Guide Dental °The [employer]’s “211” is a great source of information about community services available.  Access by dialing 2-1-1 from anywhere in [place], or by website -  www.nc211.org.  ° °Other Local Resources (Updated 09/2015) ° °Dental  Care °  °Services ° °  °Phone Number and Address  °Cost  °[facility] For children 0 - 29 years of age:  °• Cleaning °• Tooth brushing/flossing instruction °• Sealants, fillings, crowns °• Extractions °• Emergency treatment  [phone] °[address] °[place], [place] [address] Charges based on family income.  Medicaid and some insurance plans accepted.   °  °[facility] [place] • Cleaning °• Sealants, fillings, crowns °• Extractions °• Emergency treatment [phone] °[address] °[place], [place] ° Pregnant women 18 years of age or older with a Medicaid card  °[facility] [place] • Cleaning °• Sealants, fillings, crowns °• Extractions °• Emergency treatment [phone] °[address] °[place], [place] Pregnant women 18 years of age or older with a Medicaid card  °[facility] For children 0 - 29 years of age:  °• Cleaning °• Tooth brushing/flossing instruction °• Sealants, fillings, crowns °• Extractions °• Emergency treatment °Limited orthodontic services for patients with Medicaid [phone] °[address] °[place], [place] [address] Medicaid and [place] Health Choice cover for children up to age 29 and pregnant women.  Parents of children up to age 29 without Medicaid pay a reduced fee at time of service.  °[facility] [place] For children 0 - 29 years of age:  °• Cleaning °• Tooth brushing/flossing instruction °• Sealants, fillings, crowns °• Extractions °• Emergency treatment °Limited orthodontic services for patients with Medicaid  [phone] °[address] °[place], [place].  Medicaid and [place] Health Choice cover for children up to age 29 and pregnant women.  Parents of children up to age 29 without Medicaid pay a reduced fee.  °[facility] • Cleaning °• Sealants, fillings, crowns °• Extractions ° °Hours: Tuesdays and Thursdays, 4:15 - 8 pm [phone] °[address] °[place], [place] [address] Services free of charge to [place] residents ages 18-64 who do not have health insurance, Medicare, Medicaid, or VA benefits and fall within federal poverty guidelines  °[facility] ° ° ° Provides dental care in addition to primary medical care, nutritional counseling, and pharmacy: °• Cleaning °• Sealants, fillings, crowns °• Extractions ° ° ° ° ° ° ° ° ° ° ° ° ° ° ° ° ° [phone] °[facility], [address] °[place], [place] ° °[phone] °[facility], [address] [place], [place] ° °[phone] °[facility] °[place], [place] ° °[phone] °[facility], [address] °[place], [place] ° °[phone] °[facility] °[address] °[place], [place] Accepts Medicaid, Medicare, most insurance.  Also provides services available to all with fees adjusted based on ability to pay.    °[facility] • Cleaning °• Tooth brushing/flossing instruction °• Sealants, fillings, crowns °• Extractions °• Emergency treatment °Hours: Tuesdays, Thursdays, and Fridays from 8 am to 5 pm by appointment only. [phone] °[address] °[place], [place] [address] [place] residents with Medicaid (depending on eligibility) and children with  Health Choice - call for more information.  °  [facility] • Extractions only ° °Hours: 2nd and 4th Thursday of each month from 6:30 am - 9 am.   [phone] ext. [phone] °[address] °[place], [place] [address] Ages 18 and older only.  Patients are seen on a first come, first served basis.  °[facility] • Cleanings °• Fillings °• Extractions °• Orthodontics °• Endodontics °• Implants/Crowns/Bridges °• Complete and partial dentures [phone] °[place], [place] Patients must complete an application for services.  There is often a waiting list.   ° °

## 2016-03-11 NOTE — ED Provider Notes (Signed)
CSN: [id number]     Arrival date & time 03/11/16  [id number] History  By signing my name below, I, Brandon Wallace, attest that this documentation has been prepared under the direction and in the presence of non-physician practitioner, Terance HartKelly Jentry Warnell, PA-C. Electronically Signed: Freida Busmaniana Wallace, Scribe. 03/11/2016. 10:03 AM.  Chief Complaint  Patient presents with  . Dental Pain   The history is provided by the patient. No language interpreter was used.    HPI Comments:  Brandon Wallace is a 30 y.o. male who presents to the Emergency Department complaining of moderate left sided facial swelling since yesterday with associated left lower dental pain. Pt states he has cavity in a tooth in that area. He has been applying ice without relief.   Pt has not been to a dentist in years and does not have one he can follow up with. Pt is UTD with all childhood vaccines. Denies fever, headache, ear pain, difficulty swallowing.   Past Medical History  Diagnosis Date  . Dental crown present   . Metacarpal bone fracture 10/27/2015    right small   Past Surgical History  Procedure Laterality Date  . No past surgeries    . Open reduction internal fixation (orif) metacarpal Right 10/29/2015    Procedure: OPEN REDUCTION INTERNAL FIXATION (ORIF) RIGHT SMALL RING METACARPAL FRACTURE;  Surgeon: Dairl PonderMatthew Weingold, MD;  Location: [facility];  Service: Orthopedics;  Laterality: Right;   No family history on file. Social History  Substance Use Topics  . Smoking status: Former Smoker    Quit date: 09/05/2006  . Smokeless tobacco: Never Used  . Alcohol Use: Yes     Comment: occasionally    Review of Systems  Constitutional: Negative for fever.  HENT: Positive for dental problem and facial swelling.     Allergies  Penicillins  Home Medications   Prior to Admission medications   Medication Sig Start Date End Date Taking? Authorizing Provider  oxyCODONE-acetaminophen (PERCOCET/ROXICET) 5-325 MG tablet  Take 2 tablets by mouth every 4 (four) hours as needed for severe pain. 10/27/15   Samantha Tripp Dowless, PA-C  oxyCODONE-acetaminophen (ROXICET) 5-325 MG tablet Take 1 tablet by mouth every 4 (four) hours as needed for severe pain. 10/29/15   Dairl PonderMatthew Weingold, MD   BP 136/84 mmHg  Pulse 67  Temp(Src) 98.3 F (36.8 C)  Resp 18  Ht 5\' 8"  (1.727 m)  Wt 151 lb (68.493 kg)  BMI 22.96 kg/m2  SpO2 100%   Physical Exam  Constitutional: He is oriented to person, place, and time. He appears well-developed and well-nourished. No distress.  HENT:  Head: Normocephalic and atraumatic.  Several dental caries on back molars. Tenderness to palpation of back left molars and cheek. Moderate swelling over lower jaw without extension in to neck. No redness.   Eyes: Conjunctivae are normal. Pupils are equal, round, and reactive to light. Right eye exhibits no discharge. Left eye exhibits no discharge. No scleral icterus.  Neck: Normal range of motion.  Cardiovascular: Normal rate.   Pulmonary/Chest: Effort normal. No respiratory distress.  Abdominal: Soft. He exhibits no distension.  Neurological: He is alert and oriented to person, place, and time.  Skin: Skin is warm and dry.  Psychiatric: He has a normal mood and affect.    ED Course  Procedures   DIAGNOSTIC STUDIES:  Oxygen Saturation is 100% on RA, normal by my interpretation.    COORDINATION OF CARE:  10:03 AM Will discharge with dental resources and antibiotics.  Discussed treatment plan with pt at bedside and pt agreed to plan.    MDM   Final diagnoses:  Pain, dental   30 year old male with dentalgia due to dental caries.  No abscess requiring immediate incision and drainage.  Shared visit with Dr. Adriana Simasook. Exam not concerning for Ludwig's angina or pharyngeal abscess.  Will treat with Clindamycin and Ibuprofen. Pt instructed to follow-up with dentist.  Discussed return precautions. Pt safe for discharge.  I personally performed the  services described in this documentation, which was scribed in my presence. The recorded information has been reviewed and is accurate.    Bethel BornKelly Marie Kensley Valladares, PA-C 03/11/16 1134  Donnetta HutchingBrian Cook, MD 03/12/16 [phone]

## 2016-03-11 NOTE — ED Notes (Signed)
Woke up yesterday morning with swollen left side of face and toothache. Has tried otc without relief.

## 2017-01-17 ENCOUNTER — Emergency Department (HOSPITAL_COMMUNITY)
Admission: EM | Admit: 2017-01-17 | Discharge: 2017-01-17 | Disposition: A | Discharge status: Home / Self Care | Payer: Self-pay | Attending: Emergency Medicine | Admitting: Emergency Medicine

## 2017-01-17 ENCOUNTER — Encounter (HOSPITAL_COMMUNITY): Payer: Self-pay

## 2017-01-17 DIAGNOSIS — K047 Periapical abscess without sinus: Secondary | ICD-10-CM | POA: Insufficient documentation

## 2017-01-17 DIAGNOSIS — Z79899 Other long term (current) drug therapy: Secondary | ICD-10-CM | POA: Insufficient documentation

## 2017-01-17 DIAGNOSIS — Z87891 Personal history of nicotine dependence: Secondary | ICD-10-CM | POA: Insufficient documentation

## 2017-01-17 MED ORDER — HYDROCODONE-ACETAMINOPHEN 5-325 MG PO TABS
1.0000 | ORAL_TABLET | Freq: Once | ORAL | Status: AC
  Administered 2017-01-17: 1 via ORAL
  Filled 2017-01-17: qty 1

## 2017-01-17 MED ORDER — AMOXICILLIN 500 MG PO CAPS: 500.0000 mg | ORAL_CAPSULE | Freq: Two times a day (BID) | ORAL | 0 refills | Status: DC

## 2017-01-17 MED ORDER — MELOXICAM 15 MG PO TABS: 15.0000 mg | ORAL_TABLET | Freq: Every day | ORAL | 0 refills | Status: DC

## 2017-01-17 NOTE — ED Triage Notes (Signed)
Per Pt, Pt is coming from home with left lower dental pain x 2 weeks. Pt has tried OTC medication with no relief.

## 2017-01-17 NOTE — ED Notes (Signed)
Pt. Educated not to drive after narcotic. Pt. Verbalized understanding. Pt. States his friend is driving him home.

## 2017-01-17 NOTE — ED Provider Notes (Signed)
[facility]-EMERGENCY DEPT Provider Note    By signing my name below, I, Brandon Wallace, attest that this documentation has been prepared under the direction and in the presence of Arthor CaptainAbigail Arin Vanosdol, PA-C. Electronically Signed: Earmon PhoenixJennifer Wallace, ED Scribe. 01/17/17. 3:51 PM.    History   Chief Complaint Chief Complaint  Patient presents with  . Dental Pain   The history is provided by the patient and medical records. No language interpreter was used.    Brandon Wallace is a 31 y.o. male who presents to the Emergency Department complaining of left lower dental pain that began 2 weeks ago. He reports associated swelling of the gingiva and severe pain. He has taken OTC pain medication with no significant relief. Touching the area increases the pain. He denies alleviating factors. He denies fever, chills, nausea, vomiting, drooling, difficulty breathing or swallowing. He is a smoker. He states he is allergic to PCN but does not know his reaction.    Past Medical History:  Diagnosis Date  . Dental crown present   . Metacarpal bone fracture 10/27/2015   right small    There are no active problems to display for this patient.   Past Surgical History:  Procedure Laterality Date  . NO PAST SURGERIES    . OPEN REDUCTION INTERNAL FIXATION (ORIF) METACARPAL Right 10/29/2015   Procedure: OPEN REDUCTION INTERNAL FIXATION (ORIF) RIGHT SMALL RING METACARPAL FRACTURE;  Surgeon: Dairl PonderMatthew Weingold, MD;  Location: [facility];  Service: Orthopedics;  Laterality: Right;       Home Medications    Prior to Admission medications   Medication Sig Start Date End Date Taking? Authorizing Provider  clindamycin (CLEOCIN) 150 MG capsule Take 3 capsules (450 mg total) by mouth 3 (three) times daily. 03/11/16   Bethel BornGekas, Kelly Marie, PA-C  ibuprofen (ADVIL,MOTRIN) 600 MG tablet Take 1 tablet (600 mg total) by mouth 3 (three) times daily. 03/11/16   Bethel BornGekas, Kelly Marie, PA-C    Family History No family  history on file.  Social History Social History  Substance Use Topics  . Smoking status: Former Smoker    Quit date: 09/05/2006  . Smokeless tobacco: Never Used  . Alcohol use Yes     Comment: occasionally     Allergies   Penicillins   Review of Systems Review of Systems  Constitutional: Negative for chills and fever.  HENT: Positive for dental problem. Negative for drooling and trouble swallowing.   Respiratory: Negative for shortness of breath.   Gastrointestinal: Negative for nausea and vomiting.     Physical Exam Updated Vital Signs BP 117/75 (BP Location: Left Arm)   Pulse 77   Temp 98.3 F (36.8 C) (Oral)   Resp 18   Ht 5\' 8"  (1.727 m)   Wt 145 lb (65.8 kg)   SpO2 96%   BMI 22.05 kg/m   Physical Exam  Constitutional: He is oriented to person, place, and time. He appears well-developed and well-nourished.  HENT:  Head: Normocephalic and atraumatic.  Second left lower molar with putty in place. Induration and swelling of gingiva surrounding tooth. TTP. Swelling of left mandible.  Neck: Normal range of motion.  Cardiovascular: Normal rate.   Pulmonary/Chest: Effort normal.  Musculoskeletal: Normal range of motion.  Neurological: He is alert and oriented to person, place, and time.  Skin: Skin is warm and dry.  Psychiatric: He has a normal mood and affect. His behavior is normal.  Nursing note and vitals reviewed.    ED Treatments / Results  DIAGNOSTIC STUDIES: Oxygen Saturation is 96% on RA, adequate by my interpretation.   COORDINATION OF CARE: 3:47 PM- Will prescribe Clindamycin and give dental resources. Pt verbalizes understanding and agrees to plan.  Medications  HYDROcodone-acetaminophen (NORCO/VICODIN) 5-325 MG per tablet 1 tablet (not administered)    Labs (all labs ordered are listed, but only abnormal results are displayed) Labs Reviewed - No data to display  EKG  EKG Interpretation None       Radiology No results  found.  Procedures Procedures (including critical care time)  Medications Ordered in ED Medications  HYDROcodone-acetaminophen (NORCO/VICODIN) 5-325 MG per tablet 1 tablet (not administered)     Initial Impression / Assessment and Plan / ED Course  I have reviewed the triage vital signs and the nursing notes.  Pertinent labs & imaging results that were available during my care of the patient were reviewed by me and considered in my medical decision making (see chart for details).     Patient with dentalgia. No abscess requiring immediate incision and drainage. Exam not concerning for Ludwig's angina or pharyngeal abscess. Will treat with Clindamycin. Pt instructed to follow-up with dentist.  Discussed return precautions. Pt safe for discharge.   Final Clinical Impressions(s) / ED Diagnoses   Final diagnoses:  Dental infection    New Prescriptions New Prescriptions   No medications on file    I personally performed the services described in this documentation, which was scribed in my presence. The recorded information has been reviewed and is accurate.       Arthor Captain, PA-C 01/17/17 2055    Derwood Kaplan, MD 01/17/17 2135

## 2017-01-17 NOTE — Discharge Instructions (Signed)
You have a dental infection. It is very important that you get evaluated by a dentist as soon as possible. Call tomorrow to schedule an appointment. Ibuprofen as needed for pain. Take your full course of antibiotics. Read the instructions below. ° °Eat a soft or liquid diet and rinse your mouth out after meals with warm water. You should see a dentist or return here at once if you have increased swelling, increased pain or uncontrolled bleeding from the site of your injury. ° °SEEK MEDICAL CARE IF:  °You have increased pain not controlled with medicines.  °You have swelling around your tooth, in your face or neck.  °You have bleeding which starts, continues, or gets worse.  °You have a fever >101 °If you are unable to open your mouth °

## 2017-01-28 ENCOUNTER — Emergency Department (HOSPITAL_COMMUNITY)
Admission: EM | Admit: 2017-01-28 | Discharge: 2017-01-28 | Disposition: A | Discharge status: Home / Self Care | Payer: Self-pay | Attending: Emergency Medicine | Admitting: Emergency Medicine

## 2017-01-28 ENCOUNTER — Encounter (HOSPITAL_COMMUNITY): Payer: Self-pay | Admitting: Emergency Medicine

## 2017-01-28 DIAGNOSIS — K047 Periapical abscess without sinus: Secondary | ICD-10-CM | POA: Insufficient documentation

## 2017-01-28 DIAGNOSIS — Z87891 Personal history of nicotine dependence: Secondary | ICD-10-CM | POA: Insufficient documentation

## 2017-01-28 DIAGNOSIS — Z79899 Other long term (current) drug therapy: Secondary | ICD-10-CM | POA: Insufficient documentation

## 2017-01-28 MED ORDER — CLINDAMYCIN HCL 300 MG PO CAPS: 300.0000 mg | ORAL_CAPSULE | Freq: Three times a day (TID) | ORAL | 0 refills | Status: DC

## 2017-01-28 MED ORDER — BUPIVACAINE HCL (PF) 0.5 % IJ SOLN
10.0000 mL | Freq: Once | INTRAMUSCULAR | Status: AC
  Administered 2017-01-28: 10 mL
  Filled 2017-01-28: qty 10

## 2017-01-28 NOTE — Discharge Instructions (Signed)
Call the dentist today for an appointment as soon as possible - otherwise the infection can spread and become more problematic and expensive. Start the antibiotics prescribed.

## 2017-01-28 NOTE — ED Provider Notes (Addendum)
I was present for the critical parts of the procedure.  INCISION AND DRAINAGE Performed by: Anselm PancoastShawn C Joy Consent: Verbal consent obtained. Risks and benefits: risks, benefits and alternatives were discussed Type: abscess  Body area: Left lower gingival surface  Anesthesia: Inferior alveolar nerve block  Incision was made with a scalpel.  Local anesthetic: 0.5% bupivacaine   Anesthetic total: 2 ml  Complexity: complex Blunt dissection to break up loculations  Drainage: purulent  Drainage amount: Scant   Packing material: None  Patient tolerance: Patient tolerated the procedure well with no immediate complications.   NERVE BLOCK Performed by: Anselm PancoastShawn C Joy Consent: Verbal consent obtained. Required items: required blood products, implants, devices, and special equipment available Time out: Immediately prior to procedure a "time out" was called to verify the correct patient, procedure, equipment, support staff and site/side marked as required.  Indication: Procedural anesthesia  Nerve block body site: Mouth - Left inferior alveolar  Preparation: Patient was prepped and draped in the usual sterile fashion. Needle gauge: 23 G Location technique: anatomical landmarks  Local anesthetic: 0.5% bupivacaine   Anesthetic total: 2 ml  Outcome: Anesthesia achieved  Patient tolerance: Patient tolerated the procedure well with no immediate complications.     Anselm PancoastJoy, Shawn C, PA-C 01/28/17 0635    Anselm PancoastJoy, Shawn C, PA-C 01/28/17 [id number]    Derwood KaplanNanavati, Ra Pfiester, MD 01/28/17 [id number]    Derwood KaplanNanavati, Evanell Redlich, MD 02/03/17 [id number]

## 2017-01-28 NOTE — ED Triage Notes (Signed)
Pt c/o continues to have dental pain and swelling to left lower tooth. Pt was given amoxicillin recently, reports that he has 2 pills left. Pt reports allergy to penicillin and think he may be having an allergic reaction to the amoxicillin. Pt denies shortness of breath or rash.

## 2017-02-03 NOTE — ED Provider Notes (Signed)
[facility] Provider Note   CSN: [id number] Arrival date & time: 01/28/17  [id number]     History   Chief Complaint Chief Complaint  Patient presents with  . Dental Pain    HPI Brandon Wallace is a 31 y.o. male.  HPI Pt comes in with cc of dental pain and swelling. Pt has pain and swelling to the L side of the jaw. Pt took amoxicillin for his dental infection that was diagnosed last week, but he never saw the Dentist. Pt has no headaches, neck pain, difficulty breathing or opening his mouth. Pt has no serious medical hx.   Past Medical History:  Diagnosis Date  . Dental crown present   . Metacarpal bone fracture 10/27/2015   right small    There are no active problems to display for this patient.   Past Surgical History:  Procedure Laterality Date  . NO PAST SURGERIES    . OPEN REDUCTION INTERNAL FIXATION (ORIF) METACARPAL Right 10/29/2015   Procedure: OPEN REDUCTION INTERNAL FIXATION (ORIF) RIGHT SMALL RING METACARPAL FRACTURE;  Surgeon: Dairl PonderMatthew Weingold, MD;  Location: [facility];  Service: Orthopedics;  Laterality: Right;       Home Medications    Prior to Admission medications   Medication Sig Start Date End Date Taking? Authorizing Provider  amoxicillin (AMOXIL) 500 MG capsule Take 1 capsule (500 mg total) by mouth 2 (two) times daily. 01/17/17   Ward, Chase PicketJaime Pilcher, PA-C  clindamycin (CLEOCIN) 300 MG capsule Take 1 capsule (300 mg total) by mouth 3 (three) times daily. 01/28/17   Derwood KaplanNanavati, Kaleb Linquist, MD  ibuprofen (ADVIL,MOTRIN) 600 MG tablet Take 1 tablet (600 mg total) by mouth 3 (three) times daily. 03/11/16   Bethel BornGekas, Kelly Marie, PA-C  meloxicam (MOBIC) 15 MG tablet Take 1 tablet (15 mg total) by mouth daily. Take 1 daily with food. 01/17/17   Arthor CaptainHarris, Abigail, PA-C    Family History No family history on file.  Social History Social History  Substance Use Topics  . Smoking status: Former Smoker    Quit date: 09/05/2006  . Smokeless tobacco:  Never Used  . Alcohol use Yes     Comment: occasionally     Allergies   Penicillins   Review of Systems Review of Systems  Constitutional: Positive for activity change. Negative for fever.  HENT: Positive for dental problem.   Allergic/Immunologic: Negative for immunocompromised state.  Hematological: Does not bruise/bleed easily.     Physical Exam Updated Vital Signs BP (!) 127/91 (BP Location: Left Arm)   Pulse 60   Temp 98.2 F (36.8 C) (Oral)   Resp 16   SpO2 99%   Physical Exam  Constitutional: He is oriented to person, place, and time. He appears well-developed.  HENT:  Head: Atraumatic.  Pt has no trismus, stridor or crepitus over the neck. Pt has no tonsillar enlargement and exudates.   Pt does have some fluctuance around the gingiva of his lower teeth.  Neck: Neck supple.  Cardiovascular: Normal rate.   Pulmonary/Chest: Effort normal.  Neurological: He is alert and oriented to person, place, and time.  Skin: Skin is warm.  Nursing note and vitals reviewed.    ED Treatments / Results  Labs (all labs ordered are listed, but only abnormal results are displayed) Labs Reviewed - No data to display  EKG  EKG Interpretation None       Radiology No results found.  Procedures Procedures (including critical care time)  Medications Ordered in ED Medications  bupivacaine (MARCAINE) 0.5 % injection 10 mL (10 mLs Infiltration Given by Other 01/28/17 0630)     Initial Impression / Assessment and Plan / ED Course  I have reviewed the triage vital signs and the nursing notes.  Pertinent labs & imaging results that were available during my care of the patient were reviewed by me and considered in my medical decision making (see chart for details).     DDx includes: - Periapical tooth infection - Dental abscess - Gingivitis - Dental trauma - Pulpitis - Nerve root compression  Clinically, pt has an abscess that we will drain. He needs to see a  Education officer, community and f/u info provided. Strict ER return precautions have been discussed, and patient is agreeing with the plan and is comfortable with the workup done and the recommendations from the ER.   Final Clinical Impressions(s) / ED Diagnoses   Final diagnoses:  Dental abscess    New Prescriptions Discharge Medication List as of 01/28/2017  6:53 AM       Derwood Kaplan, MD 02/03/17 4098

## 2017-02-07 IMAGING — CR DG HAND COMPLETE 3+V*R*
3 series · 3 of 3 positions shown · non-contrast
Comparison: None.

CLINICAL DATA: Punched a wall playing basketball.

EXAM:
RIGHT HAND - COMPLETE 3+ VIEW

[hand pa]
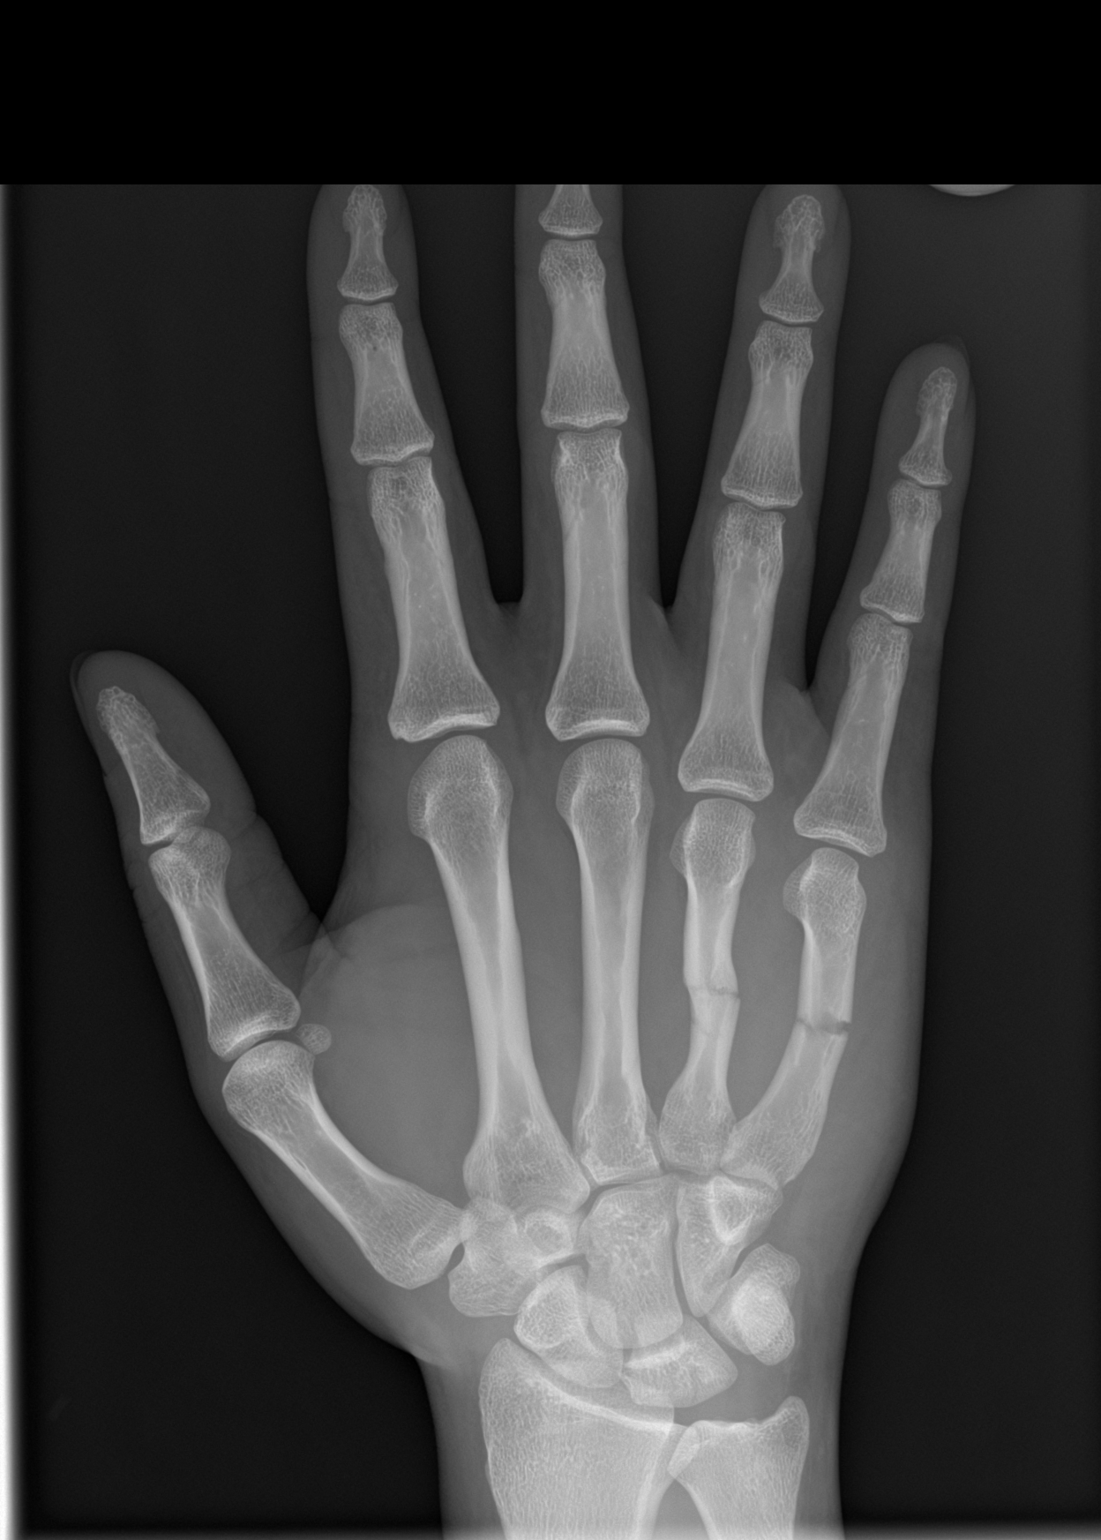

[hand obl]
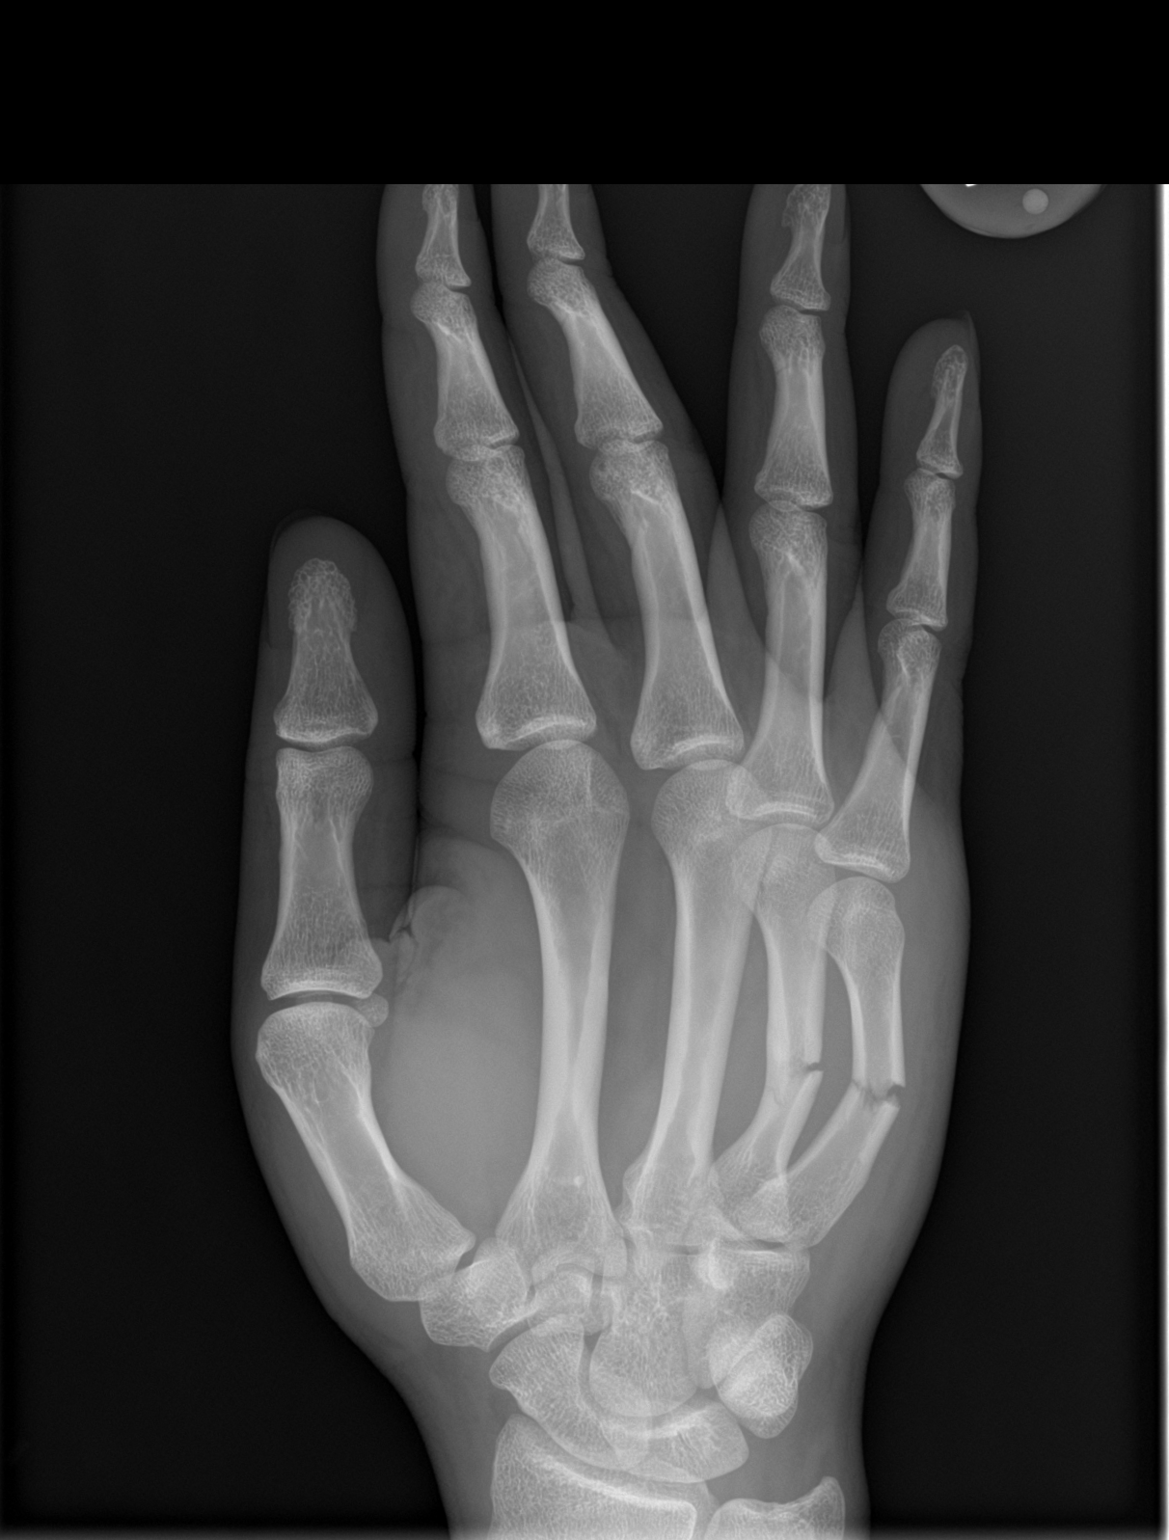

[hand lat]
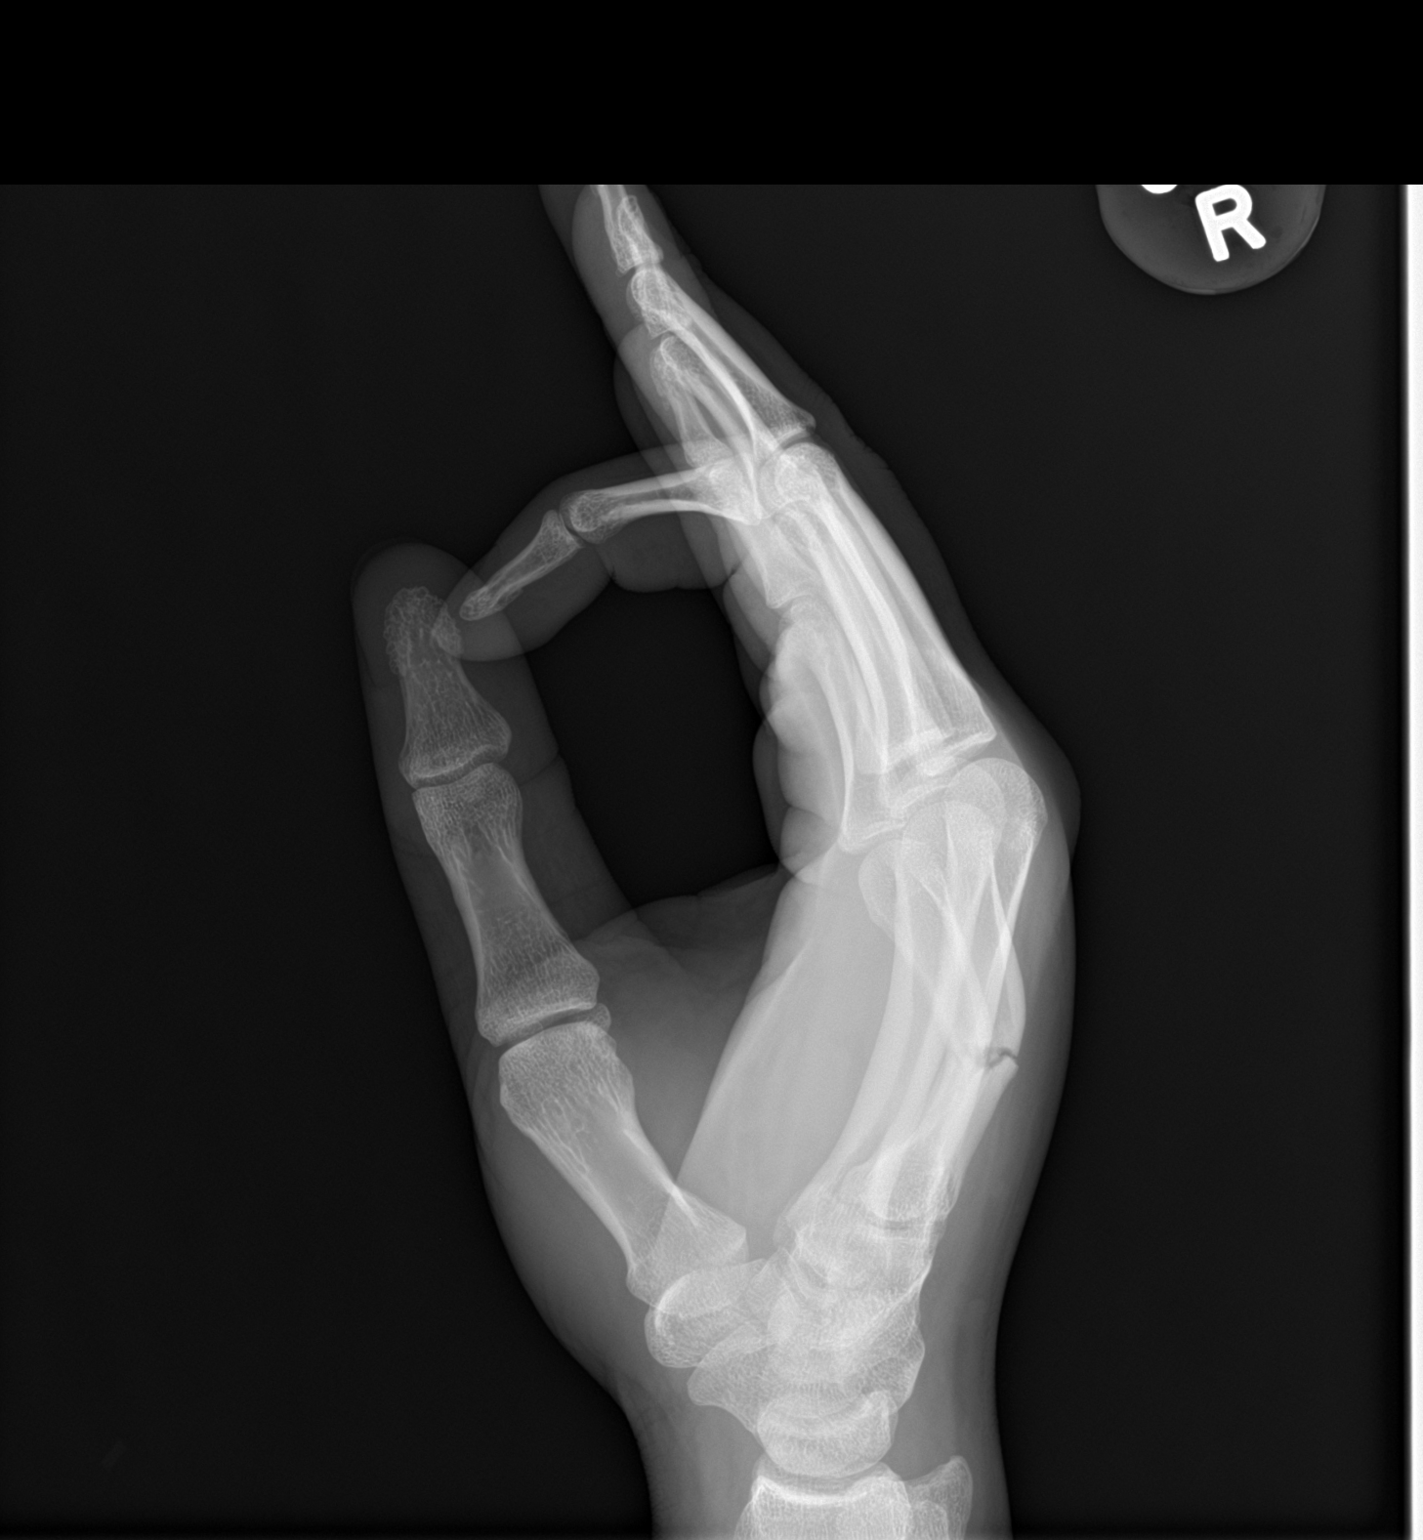

[3 of 3 positions shown; findings below may reference images not displayed]

FINDINGS: There are midshaft fractures of the fourth and fifth metacarpals
with a dorsal apex angulation at the fracture sites. The joint
spaces are maintained. No other fractures are identified.
IMPRESSION: Angulated midshaft fractures of the fourth and fifth metacarpals.

## 2018-02-18 ENCOUNTER — Encounter (HOSPITAL_COMMUNITY): Payer: Self-pay | Admitting: *Deleted

## 2018-02-18 ENCOUNTER — Emergency Department (HOSPITAL_COMMUNITY)
Admission: EM | Admit: 2018-02-18 | Discharge: 2018-02-18 | Disposition: A | Discharge status: Home / Self Care | Payer: No Typology Code available for payment source | Attending: Emergency Medicine | Admitting: Emergency Medicine

## 2018-02-18 ENCOUNTER — Emergency Department (HOSPITAL_COMMUNITY): Payer: No Typology Code available for payment source

## 2018-02-18 DIAGNOSIS — Z87891 Personal history of nicotine dependence: Secondary | ICD-10-CM | POA: Insufficient documentation

## 2018-02-18 DIAGNOSIS — Y929 Unspecified place or not applicable: Secondary | ICD-10-CM | POA: Diagnosis not present

## 2018-02-18 DIAGNOSIS — Y939 Activity, unspecified: Secondary | ICD-10-CM | POA: Diagnosis not present

## 2018-02-18 DIAGNOSIS — Z041 Encounter for examination and observation following transport accident: Secondary | ICD-10-CM | POA: Diagnosis present

## 2018-02-18 DIAGNOSIS — R51 Headache: Secondary | ICD-10-CM | POA: Diagnosis not present

## 2018-02-18 DIAGNOSIS — R42 Dizziness and giddiness: Secondary | ICD-10-CM | POA: Insufficient documentation

## 2018-02-18 DIAGNOSIS — S0990XA Unspecified injury of head, initial encounter: Secondary | ICD-10-CM

## 2018-02-18 DIAGNOSIS — Y999 Unspecified external cause status: Secondary | ICD-10-CM | POA: Insufficient documentation

## 2018-02-18 MED ORDER — IBUPROFEN 600 MG PO TABS: 600.0000 mg | ORAL_TABLET | Freq: Four times a day (QID) | ORAL | 0 refills | Status: DC | PRN

## 2018-02-18 MED ORDER — ACETAMINOPHEN 500 MG PO TABS
1000.0000 mg | ORAL_TABLET | Freq: Once | ORAL | Status: AC
  Administered 2018-02-18: 1000 mg via ORAL
  Filled 2018-02-18: qty 2
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
## 2018-07-28 ENCOUNTER — Ambulatory Visit (HOSPITAL_COMMUNITY)
Admission: EM | Admit: 2018-07-28 | Discharge: 2018-07-28 | Disposition: A | Discharge status: Home / Self Care | Payer: Self-pay | Attending: Family Medicine | Admitting: Family Medicine

## 2018-07-28 ENCOUNTER — Encounter (HOSPITAL_COMMUNITY): Payer: Self-pay | Admitting: Emergency Medicine

## 2018-07-28 DIAGNOSIS — Z87891 Personal history of nicotine dependence: Secondary | ICD-10-CM | POA: Insufficient documentation

## 2018-07-28 DIAGNOSIS — Z202 Contact with and (suspected) exposure to infections with a predominantly sexual mode of transmission: Secondary | ICD-10-CM | POA: Insufficient documentation

## 2018-07-28 DIAGNOSIS — Z880 Allergy status to penicillin: Secondary | ICD-10-CM | POA: Insufficient documentation

## 2018-07-28 NOTE — Discharge Instructions (Signed)
Testing sent, you will be contacted with any positive results that requires further treatment. Refrain from sexual activity for now, if any testing is positive, no sexual activity for 7 days after treatment to allow medication to work.

## 2018-07-28 NOTE — ED Triage Notes (Signed)
Pt states he was told by someone they tested positive for gonorrhea and that he needed to come in and get tested. Denies symptoms.

## 2018-07-28 NOTE — ED Provider Notes (Signed)
[facility]    CSN: [id number] Arrival date & time: 07/28/18  1855     History   Chief Complaint Chief Complaint  Patient presents with  . SEXUALLY TRANSMITTED DISEASE    HPI Brandon Wallace is a 32 y.o. male.   32 year old male comes in for STD exposure.  States 1 of his partners was tested positive for gonorrhea.  He has penile lesion that he thinks was due to partner "sucking too hard" or "it may be bite marks", unsure when lesion appeared. Denies penile discharge, testicular swelling/pain. Denies urinary symptoms such as dysuria, hematuria, frequency. Denies fever, chills, night sweats. He is currently sexually active with 2 male partners.      Past Medical History:  Diagnosis Date  . Dental crown present   . Metacarpal bone fracture 10/27/2015   right small    There are no active problems to display for this patient.   Past Surgical History:  Procedure Laterality Date  . NO PAST SURGERIES    . OPEN REDUCTION INTERNAL FIXATION (ORIF) METACARPAL Right 10/29/2015   Procedure: OPEN REDUCTION INTERNAL FIXATION (ORIF) RIGHT SMALL RING METACARPAL FRACTURE;  Surgeon: Dairl Ponder, MD;  Location: [facility];  Service: Orthopedics;  Laterality: Right;       Home Medications    Prior to Admission medications   Medication Sig Start Date End Date Taking? Authorizing Provider  meloxicam (MOBIC) 15 MG tablet Take 1 tablet (15 mg total) by mouth daily. Take 1 daily with food. Patient not taking: Reported on 07/28/2018 01/17/17   Arthor Captain, PA-C    Family History No family history on file.  Social History Social History   Tobacco Use  . Smoking status: Former Smoker    Last attempt to quit: 09/05/2006    Years since quitting: 11.9  . Smokeless tobacco: Never Used  Substance Use Topics  . Alcohol use: Yes    Comment: occasionally  . Drug use: Yes    Types: Marijuana     Allergies   Penicillins   Review of Systems Review  of Systems  Reason unable to perform ROS: See HPI as above.     Physical Exam Triage Vital Signs ED Triage Vitals  Enc Vitals Group     BP 07/28/18 1917 120/79     Pulse Rate 07/28/18 1916 75     Resp 07/28/18 1916 16     Temp 07/28/18 1916 98.4 F (36.9 C)     Temp src --      SpO2 07/28/18 1916 99 %     Weight --      Height --      Head Circumference --      Peak Flow --      Pain Score 07/28/18 1917 0     Pain Loc --      Pain Edu? --      Excl. in GC? --    No data found.  Updated Vital Signs BP 120/79   Pulse 75   Temp 98.4 F (36.9 C)   Resp 16   SpO2 99%   Physical Exam  Constitutional: He is oriented to person, place, and time. He appears well-developed and well-nourished. No distress.  HENT:  Head: Normocephalic and atraumatic.  Eyes: Pupils are equal, round, and reactive to light. Conjunctivae are normal.  Genitourinary:  Genitourinary Comments: Chaperone present.  Some erythema to the glans of penis without warmth, tenderness.  Skin irritation/peeling of skin to the base  of the penis with one healing wound that is nontender to palpation.  Neurological: He is alert and oriented to person, place, and time.  Skin: He is not diaphoretic.     UC Treatments / Results  Labs (all labs ordered are listed, but only abnormal results are displayed) Labs Reviewed  HIV ANTIBODY (ROUTINE TESTING W REFLEX)  RPR  URINE CYTOLOGY ANCILLARY ONLY    EKG None  Radiology No results found.  Procedures Procedures (including critical care time)  Medications Ordered in UC Medications - No data to display  Initial Impression / Assessment and Plan / UC Course  I have reviewed the triage vital signs and the nursing notes.  Pertinent labs & imaging results that were available during my care of the patient were reviewed by me and considered in my medical decision making (see chart for details).    HIV, RPR, cytology sent for testing. Will await results for  treatment. Patient to refrain from sexual activity for now. Return precautions given.  Final Clinical Impressions(s) / UC Diagnoses   Final diagnoses:  STD exposure    ED Prescriptions    None        Belinda FisherYu,  V, PA-C 07/28/18 1948

## 2018-07-29 LAB — HIV ANTIBODY (ROUTINE TESTING W REFLEX): HIV Screen 4th Generation wRfx: NONREACTIVE

## 2018-07-29 LAB — RPR: RPR Ser Ql: NONREACTIVE

## 2018-07-31 LAB — URINE CYTOLOGY ANCILLARY ONLY
Chlamydia: NEGATIVE
NEISSERIA GONORRHEA: NEGATIVE
TRICH (WINDOWPATH): NEGATIVE

## 2019-01-16 ENCOUNTER — Telehealth (HOSPITAL_COMMUNITY): Payer: Self-pay

## 2019-01-16 NOTE — Telephone Encounter (Signed)
Patient presents to [facility] requesting results from recent STD testing. Patient verified name and birthday and negative results were given. Pt educated on use of MyChart for future result retrieval.

## 2019-09-17 ENCOUNTER — Ambulatory Visit (HOSPITAL_COMMUNITY)
Admission: EM | Admit: 2019-09-17 | Discharge: 2019-09-17 | Disposition: A | Discharge status: Home / Self Care | Payer: Self-pay | Attending: Family Medicine | Admitting: Family Medicine

## 2019-09-17 ENCOUNTER — Encounter (HOSPITAL_COMMUNITY): Payer: Self-pay

## 2019-09-17 ENCOUNTER — Other Ambulatory Visit: Payer: Self-pay

## 2019-09-17 DIAGNOSIS — K047 Periapical abscess without sinus: Secondary | ICD-10-CM

## 2019-09-17 DIAGNOSIS — K029 Dental caries, unspecified: Secondary | ICD-10-CM

## 2019-09-17 MED ORDER — CHLORHEXIDINE GLUCONATE 0.12 % MT SOLN: 15.0000 mL | Freq: Two times a day (BID) | OROMUCOSAL | 0 refills | Status: AC

## 2019-09-17 MED ORDER — CLINDAMYCIN HCL 150 MG PO CAPS: 150.0000 mg | ORAL_CAPSULE | Freq: Three times a day (TID) | ORAL | 0 refills | Status: DC

## 2019-09-17 MED ORDER — OXYCODONE-ACETAMINOPHEN 5-325 MG PO TABS: 1.0000 | ORAL_TABLET | Freq: Four times a day (QID) | ORAL | 0 refills | Status: AC | PRN

## 2019-09-17 NOTE — Discharge Instructions (Addendum)
Floss your teeth each night

## 2019-09-17 NOTE — ED Provider Notes (Addendum)
[place]    CSN: [id number] Arrival date & time: 09/17/19  1105      History   Chief Complaint Chief Complaint  Patient presents with  . Dental Pain    HPI Brandon Wallace is a 33 y.o. male.   Is an established 34 year old [place] [facility] urgent care patient.  Pt presents with left side dental pain X 2 days.   Pt has had a dental abscess in that area previously.  He has had some dental extractions in that area before as well.  Patient works as a Market researcher     Past Medical History:  Diagnosis Date  . Dental crown present   . Metacarpal bone fracture 10/27/2015   right small    There are no problems to display for this patient.   Past Surgical History:  Procedure Laterality Date  . NO PAST SURGERIES    . OPEN REDUCTION INTERNAL FIXATION (ORIF) METACARPAL Right 10/29/2015   Procedure: OPEN REDUCTION INTERNAL FIXATION (ORIF) RIGHT SMALL RING METACARPAL FRACTURE;  Surgeon: Charlotte Crumb, MD;  Location: [facility];  Service: Orthopedics;  Laterality: Right;       Home Medications    Prior to Admission medications   Medication Sig Start Date End Date Taking? Authorizing Provider  clindamycin (CLEOCIN) 150 MG capsule Take 1 capsule (150 mg total) by mouth 3 (three) times daily. 09/17/19   Robyn Haber, MD  oxyCODONE-acetaminophen (PERCOCET/ROXICET) 5-325 MG tablet Take 1-2 tablets by mouth every 6 (six) hours as needed for moderate pain or severe pain. 09/17/19   Robyn Haber, MD    Family History Family History  Family history unknown: Yes    Social History Social History   Tobacco Use  . Smoking status: Former Smoker    Quit date: 09/05/2006    Years since quitting: 13.0  . Smokeless tobacco: Never Used  Substance Use Topics  . Alcohol use: Yes    Comment: occasionally  . Drug use: Yes    Types: Marijuana     Allergies   Penicillins   Review of Systems Review of Systems  HENT: Positive for dental  problem.   All other systems reviewed and are negative.    Physical Exam Triage Vital Signs ED Triage Vitals  Enc Vitals Group     BP 09/17/19 1153 (!) 130/114     Pulse Rate 09/17/19 1153 (!) 57     Resp 09/17/19 1153 18     Temp 09/17/19 1153 98.6 F (37 C)     Temp Source 09/17/19 1153 Oral     SpO2 09/17/19 1153 100 %     Weight --      Height --      Head Circumference --      Peak Flow --      Pain Score 09/17/19 1151 10     Pain Loc --      Pain Edu? --      Excl. in [place]? --    No data found.  Updated Vital Signs BP (!) 130/114 (BP Location: Right Arm)   Pulse (!) 57   Temp 98.6 F (37 C) (Oral)   Resp 18   SpO2 100%    Physical Exam Vitals and nursing note reviewed.  Constitutional:      General: He is not in acute distress.    Appearance: Normal appearance. He is normal weight. He is not toxic-appearing.  HENT:     Mouth/Throat:     Comments:  Tooth #18 shows gingival swelling and carious changes at the gumline. Eyes:     Conjunctiva/sclera: Conjunctivae normal.  Pulmonary:     Effort: Pulmonary effort is normal.  Musculoskeletal:        General: Normal range of motion.     Cervical back: Normal range of motion and neck supple.  Lymphadenopathy:     Cervical: Cervical adenopathy present.  Skin:    General: Skin is warm and dry.  Neurological:     General: No focal deficit present.     Mental Status: He is alert and oriented to person, place, and time.  Psychiatric:        Mood and Affect: Mood normal.        Behavior: Behavior normal.        Thought Content: Thought content normal.      UC Treatments / Results  Labs (all labs ordered are listed, but only abnormal results are displayed) Labs Reviewed - No data to display  EKG   Radiology No results found.  Procedures Procedures (including critical care time)  Medications Ordered in UC Medications - No data to display  Initial Impression / Assessment and Plan / UC Course  I have  reviewed the triage vital signs and the nursing notes.  Pertinent labs & imaging results that were available during my care of the patient were reviewed by me and considered in my medical decision making (see chart for details).    Final Clinical Impressions(s) / UC Diagnoses   Final diagnoses:  Dental caries  Dental abscess   Discharge Instructions   None    ED Prescriptions    Medication Sig Dispense Auth. Provider   clindamycin (CLEOCIN) 150 MG capsule Take 1 capsule (150 mg total) by mouth 3 (three) times daily. 28 capsule Elvina Sidle, MD   oxyCODONE-acetaminophen (PERCOCET/ROXICET) 5-325 MG tablet Take 1-2 tablets by mouth every 6 (six) hours as needed for moderate pain or severe pain. 8 tablet Elvina Sidle, MD     I have reviewed the PDMP during this encounter.   Elvina Sidle, MD 09/17/19 1317    Elvina Sidle, MD 09/17/19 1317    Elvina Sidle, MD 09/17/19 1318

## 2019-09-17 NOTE — ED Triage Notes (Signed)
Pt presents with left side dental pain X 2 days.   Pt has had a dental abscess in that area previously.

## 2019-10-19 ENCOUNTER — Other Ambulatory Visit: Payer: Self-pay

## 2019-10-19 ENCOUNTER — Emergency Department (HOSPITAL_COMMUNITY)
Admission: EM | Admit: 2019-10-19 | Discharge: 2019-10-19 | Disposition: A | Discharge status: Home / Self Care | Payer: Self-pay | Attending: Emergency Medicine | Admitting: Emergency Medicine

## 2019-10-19 DIAGNOSIS — K029 Dental caries, unspecified: Secondary | ICD-10-CM | POA: Insufficient documentation

## 2019-10-19 DIAGNOSIS — Z87891 Personal history of nicotine dependence: Secondary | ICD-10-CM | POA: Insufficient documentation

## 2019-10-19 DIAGNOSIS — K047 Periapical abscess without sinus: Secondary | ICD-10-CM | POA: Insufficient documentation

## 2019-10-19 MED ORDER — CLINDAMYCIN HCL 150 MG PO CAPS
300.0000 mg | ORAL_CAPSULE | Freq: Once | ORAL | Status: AC
  Administered 2019-10-19: 300 mg via ORAL
  Filled 2019-10-19: qty 2

## 2019-10-19 MED ORDER — HYDROCODONE-ACETAMINOPHEN 5-325 MG PO TABS
1.0000 | ORAL_TABLET | Freq: Once | ORAL | Status: AC
  Administered 2019-10-19: 1 via ORAL
  Filled 2019-10-19: qty 1

## 2019-10-19 MED ORDER — BENZOCAINE 20 % MT GEL: 1.0000 "application " | Freq: Four times a day (QID) | OROMUCOSAL | 0 refills | Status: AC | PRN

## 2019-10-19 MED ORDER — CLINDAMYCIN HCL 300 MG PO CAPS: 300.0000 mg | ORAL_CAPSULE | Freq: Four times a day (QID) | ORAL | 0 refills | Status: AC

## 2019-10-19 MED ORDER — IBUPROFEN 600 MG PO TABS: 600.0000 mg | ORAL_TABLET | Freq: Four times a day (QID) | ORAL | 0 refills | Status: AC | PRN

## 2019-10-19 NOTE — ED Triage Notes (Signed)
Patient complains of left lower gum/dental pain , states pain to chew and open mouth

## 2019-10-19 NOTE — ED Provider Notes (Signed)
[facility] EMERGENCY DEPARTMENT Provider Note   CSN: [id number] Arrival date & time: 10/19/19  1627     History Chief Complaint  Patient presents with  . Dental Pain    Brandon Wallace is a 34 y.o. male.  Brandon Wallace is a 34 y.o. male who presents to the ED for evaluation of severe left lower dental pain.  He has been seen in the ED for dental infections multiple times in the past and has had to have dental abscesses drained.  He states that this pain has been present for about a week, he states that a month ago he was seen at urgent care for a dental infection which seemed to improve.  He reports that started coming back this week over the same tooth that seems to continually give him issues.  He reports pain is a constant throbbing ache.  He has used Motrin and Tylenol intermittently without improvement.  Has also taken some Percocets that a friend had.  He also states that he had some leftover antibiotics from a previous episode of dental pain that he started taking this morning.  He has not noted any purulent drainage from around the tooth.  He denies any difficulty swallowing, no neck pain or stiffness.  No fevers or chills.  No nausea or vomiting.        Past Medical History:  Diagnosis Date  . Dental crown present   . Metacarpal bone fracture 10/27/2015   right small    There are no problems to display for this patient.   Past Surgical History:  Procedure Laterality Date  . NO PAST SURGERIES    . OPEN REDUCTION INTERNAL FIXATION (ORIF) METACARPAL Right 10/29/2015   Procedure: OPEN REDUCTION INTERNAL FIXATION (ORIF) RIGHT SMALL RING METACARPAL FRACTURE;  Surgeon: Dairl Ponder, MD;  Location: [facility];  Service: Orthopedics;  Laterality: Right;       Family History  Family history unknown: Yes    Social History   Tobacco Use  . Smoking status: Former Smoker    Quit date: 09/05/2006    Years since quitting: 13.1  .  Smokeless tobacco: Never Used  Substance Use Topics  . Alcohol use: Yes    Comment: occasionally  . Drug use: Yes    Types: Marijuana    Home Medications Prior to Admission medications   Medication Sig Start Date End Date Taking? Authorizing Provider  benzocaine (HURRICAINE) 20 % GEL Use as directed 1 application in the mouth or throat 4 (four) times daily as needed. 10/19/19   Dartha Lodge, PA-C  chlorhexidine (PERIDEX) 0.12 % solution Use as directed 15 mLs in the mouth or throat 2 (two) times daily. 09/17/19   Elvina Sidle, MD  clindamycin (CLEOCIN) 300 MG capsule Take 1 capsule (300 mg total) by mouth 4 (four) times daily. X 7 days 10/19/19   Dartha Lodge, PA-C  ibuprofen (ADVIL) 600 MG tablet Take 1 tablet (600 mg total) by mouth every 6 (six) hours as needed. 10/19/19   Dartha Lodge, PA-C  oxyCODONE-acetaminophen (PERCOCET/ROXICET) 5-325 MG tablet Take 1-2 tablets by mouth every 6 (six) hours as needed for moderate pain or severe pain. 09/17/19   Elvina Sidle, MD    Allergies    Penicillins  Review of Systems   Review of Systems  Constitutional: Negative for chills and fever.  HENT: Positive for dental problem. Negative for facial swelling, sore throat and trouble swallowing.   Gastrointestinal: Negative  for nausea and vomiting.    Physical Exam Updated Vital Signs BP 127/90   Pulse 80   Temp 98.9 F (37.2 C) (Oral)   Resp 16   SpO2 99%   Physical Exam Vitals and nursing note reviewed.  Constitutional:      General: He is not in acute distress.    Appearance: Normal appearance. He is well-developed and normal weight. He is not diaphoretic.  HENT:     Head: Normocephalic and atraumatic.     Mouth/Throat:     Comments: Multiple teeth are in poor dentition or missing, the left lower posterior molar has evident decay with some surrounding erythema and fluctuance of the gums but no obvious drainable abscess, there is no sublingual tenderness, posterior  oropharynx is clear, normal phonation, tolerating secretions. Eyes:     General:        Right eye: No discharge.        Left eye: No discharge.  Neck:     Comments: Some mild left cervical lymphadenopathy noted, no torticollis, no stridor Pulmonary:     Effort: Pulmonary effort is normal. No respiratory distress.  Neurological:     Mental Status: He is alert.     Coordination: Coordination normal.  Psychiatric:        Behavior: Behavior normal.     ED Results / Procedures / Treatments   Labs (all labs ordered are listed, but only abnormal results are displayed) Labs Reviewed - No data to display  EKG None  Radiology No results found.  Procedures Procedures (including critical care time)  Medications Ordered in ED Medications  HYDROcodone-acetaminophen (NORCO/VICODIN) 5-325 MG per tablet 1 tablet (1 tablet Oral Given 10/19/19 1700)  clindamycin (CLEOCIN) capsule 300 mg (300 mg Oral Given 10/19/19 1700)    ED Course  I have reviewed the triage vital signs and the nursing notes.  Pertinent labs & imaging results that were available during my care of the patient were reviewed by me and considered in my medical decision making (see chart for details).    MDM Rules/Calculators/A&P                      Patient with toothache.  No gross abscess.  Exam unconcerning for Ludwig's angina or spread of infection.  Will treat with penicillin and anti-inflammatories medicine.  Urged patient to follow-up with dentist.    Final Clinical Impression(s) / ED Diagnoses Final diagnoses:  Dental infection    Rx / DC Orders ED Discharge Orders         Ordered    clindamycin (CLEOCIN) 300 MG capsule  4 times daily     10/19/19 1657    ibuprofen (ADVIL) 600 MG tablet  Every 6 hours PRN     10/19/19 1657    benzocaine (HURRICAINE) 20 % GEL  4 times daily PRN     10/19/19 1657           Jacqlyn Larsen, PA-C 10/19/19 2211    Veryl Speak, MD 10/19/19 2221

## 2019-10-19 NOTE — ED Notes (Signed)
Patient verbalizes understanding of discharge instructions. Opportunity for questioning and answers were provided. Armband removed by staff, pt discharged from ED.  

## 2019-10-19 NOTE — Discharge Instructions (Signed)
Please take entire course of antibiotics as directed.  Continue using ibuprofen 600 mg and Tylenol 1000mg  every 6 hours, as well as prescribed hurricane gel with q tip for pain.  You will need to follow-up with your dentist for continued management of this.  Return to the emergency department for fevers, swelling or pain under the tongue or in the neck, difficulty breathing or swallowing or any other new or concerning symptoms.
